# Patient Record
Sex: Male | Born: 1975 | Race: White | Hispanic: No | Marital: Single | Smoking: Former smoker
Health system: Southern US, Community
[De-identification: ages and names within clinical notes are randomized; demographics above are authoritative.]

## PROBLEM LIST (undated history)

## (undated) DIAGNOSIS — M869 Osteomyelitis, unspecified: Secondary | ICD-10-CM

## (undated) DIAGNOSIS — R55 Syncope and collapse: Secondary | ICD-10-CM

## (undated) DIAGNOSIS — E785 Hyperlipidemia, unspecified: Secondary | ICD-10-CM

## (undated) DIAGNOSIS — J302 Other seasonal allergic rhinitis: Secondary | ICD-10-CM

## (undated) DIAGNOSIS — K219 Gastro-esophageal reflux disease without esophagitis: Secondary | ICD-10-CM

## (undated) DIAGNOSIS — Z87898 Personal history of other specified conditions: Secondary | ICD-10-CM

## (undated) HISTORY — DX: Hyperlipidemia, unspecified: E78.5

## (undated) HISTORY — PX: TONSILLECTOMY: SUR1361

## (undated) HISTORY — DX: Gastro-esophageal reflux disease without esophagitis: K21.9

## (undated) HISTORY — DX: Syncope and collapse: R55

---

## 1998-08-19 ENCOUNTER — Ambulatory Visit (HOSPITAL_COMMUNITY): Admission: RE | Admit: 1998-08-19 | Discharge: 1998-08-19 | Payer: Self-pay | Admitting: Internal Medicine

## 1998-08-19 ENCOUNTER — Encounter: Payer: Self-pay | Admitting: Internal Medicine

## 2010-02-24 ENCOUNTER — Ambulatory Visit: Payer: Self-pay | Admitting: Family Medicine

## 2010-02-24 DIAGNOSIS — B354 Tinea corporis: Secondary | ICD-10-CM | POA: Insufficient documentation

## 2010-02-24 DIAGNOSIS — K219 Gastro-esophageal reflux disease without esophagitis: Secondary | ICD-10-CM | POA: Insufficient documentation

## 2010-02-25 LAB — CONVERTED CEMR LAB
ALT: 21 units/L (ref 0–53)
AST: 20 units/L (ref 0–37)
Albumin: 4.4 g/dL (ref 3.5–5.2)
Alkaline Phosphatase: 64 units/L (ref 39–117)
BUN: 13 mg/dL (ref 6–23)
Basophils Absolute: 0 10*3/uL (ref 0.0–0.1)
Basophils Relative: 0.4 % (ref 0.0–3.0)
Bilirubin, Direct: 0.1 mg/dL (ref 0.0–0.3)
CO2: 28 meq/L (ref 19–32)
Calcium: 8.5 mg/dL (ref 8.4–10.5)
Chloride: 100 meq/L (ref 96–112)
Cholesterol: 194 mg/dL (ref 0–200)
Creatinine, Ser: 0.8 mg/dL (ref 0.4–1.5)
Direct LDL: 117.1 mg/dL
Eosinophils Absolute: 0.1 10*3/uL (ref 0.0–0.7)
Eosinophils Relative: 2.6 % (ref 0.0–5.0)
GFR calc non Af Amer: 112.75 mL/min (ref >60)
Glucose, Bld: 121 mg/dL — ABNORMAL HIGH (ref 70–99)
HCT: 44.5 % (ref 39.0–52.0)
HDL: 37.1 mg/dL — ABNORMAL LOW (ref >39.00)
Hemoglobin: 15.6 g/dL (ref 13.0–17.0)
Lymphocytes Relative: 34.7 % (ref 12.0–46.0)
Lymphs Abs: 1.6 10*3/uL (ref 0.7–4.0)
MCHC: 35.2 g/dL (ref 30.0–36.0)
MCV: 88.1 fL (ref 78.0–100.0)
Monocytes Absolute: 0.3 10*3/uL (ref 0.1–1.0)
Monocytes Relative: 7 % (ref 3.0–12.0)
Neutro Abs: 2.6 10*3/uL (ref 1.4–7.7)
Neutrophils Relative %: 55.3 % (ref 43.0–77.0)
Platelets: 194 10*3/uL (ref 150.0–400.0)
Potassium: 3.7 meq/L (ref 3.5–5.1)
RBC: 5.05 M/uL (ref 4.22–5.81)
RDW: 14.2 % (ref 11.5–14.6)
Sodium: 140 meq/L (ref 135–145)
TSH: 0.67 microintl units/mL (ref 0.35–5.50)
Total Bilirubin: 0.7 mg/dL (ref 0.3–1.2)
Total CHOL/HDL Ratio: 5
Total Protein: 7 g/dL (ref 6.0–8.3)
Triglycerides: 274 mg/dL — ABNORMAL HIGH (ref 0.0–149.0)
VLDL: 54.8 mg/dL — ABNORMAL HIGH (ref 0.0–40.0)
WBC: 4.7 10*3/uL (ref 4.5–10.5)

## 2010-02-28 LAB — CONVERTED CEMR LAB
Chlamydia, Swab/Urine, PCR: NEGATIVE
GC Probe Amp, Urine: NEGATIVE

## 2010-06-07 ENCOUNTER — Telehealth: Payer: Self-pay | Admitting: Family Medicine

## 2010-06-15 ENCOUNTER — Ambulatory Visit: Payer: Self-pay | Admitting: Family Medicine

## 2010-06-15 LAB — CONVERTED CEMR LAB
Bacteria, UA: 0
Bilirubin Urine: NEGATIVE
Blood in Urine, dipstick: NEGATIVE
Casts: 0 /lpf
Glucose, Urine, Semiquant: NEGATIVE
Ketones, urine, test strip: NEGATIVE
Nitrite: NEGATIVE
Protein, U semiquant: NEGATIVE
RBC / HPF: 0
Specific Gravity, Urine: 1.015
Urine crystals, microscopic: 0 /hpf
Urobilinogen, UA: 0.2
WBC Urine, dipstick: NEGATIVE
WBC, UA: 0 cells/hpf
Yeast, UA: 0
pH: 7

## 2010-08-30 NOTE — Assessment & Plan Note (Signed)
Summary: NEW PT TO EST/CLE   Vital Signs:  Patient profile:   35 year old male Height:      71 inches Weight:      189.75 pounds BMI:     26.56 Temp:     98.2 degrees F oral Pulse rate:   96 / minute Pulse rhythm:   regular BP sitting:   130 / 86  (left arm) Cuff size:   regular  Vitals Entered By: Lewanda Rife LPN (February 24, 2010 11:24 AM) CC: New pt to establish   History of Present Illness: did not have a regular doctor in years  fairly healthy   takes omeprazole for presumed acid reflux  on it for 3 years  has heartburn every day without the medicine does not really watch diet  avoid spicy food if he feels it  antacids and zantac do not work   ventolin was given once when he had bronchitis  that was memorial weekend - better now  had an ekg at that time    quit smoking tab years ago   marijuana almost every day  helps with stress and enjoys it  lots of stress - work and every day life  no other drugs thinks he may have some anxiety - is unsure / not depressed   last Td - was in 98 needs to get update   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    Allergies (verified): No Known Drug Allergies  Past History:  Past Medical History:  seasonal nasal allergies once bout of bronchitis with reactive airways  borderline high chol  GERD  Past Surgical History: Tonsillectomy as a child no hosp   Family History: Father: Living heart attack                        skin cancer mother -- anxiety  Uncle Deceased pancreatic cancer Aunt breast cancer cousins with alcoholism (one died from cirrhosis)  Social  History: Occupation: Naval architect Single no children  Alcohol use-yes -- on the weekends ( does some binge drinking ) -- no problems with alcohol  Drug use-yes(marijuana) Regular exercise-yes- very physical job  Occupation:  employed Drug Use:  yes Does Patient Exercise:  yes  Review of Systems General:  Denies fatigue, fever, loss of appetite, and malaise. Eyes:  Denies blurring and eye irritation. CV:  Denies chest pain or discomfort, lightheadness, and palpitations. Resp:  Denies cough, shortness of breath, and wheezing. GI:  Complains of constipation and hemorrhoids; denies abdominal pain, change in bowel habits, indigestion, and nausea. GU:  Denies discharge, dysuria, genital sores, and urinary frequency. MS:  Denies joint pain, joint redness, and joint swelling. Derm:  Denies itching, lesion(s), poor wound healing, and rash. Neuro:  Denies numbness and tingling. Psych:  Denies depression, easily tearful, and irritability; may tend to be on the anxious side - not really a problem. Endo:  Denies cold intolerance, excessive thirst, excessive urination, and heat intolerance. Heme:  Denies abnormal bruising and bleeding.  Physical Exam  General:  Well-developed,well-nourished,in no acute distress; alert,appropriate and cooperative throughout examination Head:  normocephalic, atraumatic, and no abnormalities observed.   Eyes:  vision grossly intact, pupils equal, pupils round, and pupils reactive to light.  no conjunctival pallor, injection or icterus  Ears:  R ear normal and L ear normal.   Nose:  no nasal discharge.   Mouth:  pharynx pink and moist.   Neck:  supple with full rom and no masses or thyromegally, no JVD or carotid bruit  Chest Wall:  No deformities, masses, tenderness or gynecomastia noted. Lungs:  Normal respiratory effort, chest expands symmetrically. Lungs are clear to auscultation, no crackles or wheezes. Heart:  Normal rate and regular rhythm. S1 and S2 normal  without gallop, murmur, click, rub or other extra sounds. Abdomen:  Bowel sounds positive,abdomen soft and non-tender without masses, organomegaly or hernias noted. no renal bruits  Msk:  No deformity or scoliosis noted of thoracic or lumbar spine.  no acute joint changes  Pulses:  R and L carotid,radial,femoral,dorsalis pedis and posterior tibial pulses are full and equal bilaterally Extremities:  No clubbing, cyanosis, edema, or deformity noted with normal full range of motion of all joints.   Neurologic:  sensation intact to light touch, gait normal, and DTRs symmetrical and normal.  no tremor  Skin:  rash on L groin area under scrotum- some erythema / slt raised with sharp demarcation lentigos diffusely  no other skin changes tattoo noted  Cervical Nodes:  No lymphadenopathy noted Inguinal Nodes:  No significant adenopathy Psych:  normal affect, talkative and pleasant    Impression & Recommendations:  Problem # 1:  HEALTH MAINTENANCE EXAM (ICD-V70.0) Assessment New reviewed health habits including diet, exercise and skin cancer prevention reviewed health maintenance list and family history disc occas constipation disc various risks of marijuana smoking - incl lung risks  Orders: Venipuncture (16109) TLB-Lipid Panel (80061-LIPID) TLB-BMP (Basic Metabolic Panel-BMET) (80048-METABOL) TLB-CBC Platelet - w/Differential (85025-CBCD) TLB-Hepatic/Liver Function Pnl (80076-HEPATIC) TLB-TSH (Thyroid Stimulating Hormone) (84443-TSH)  Problem # 2:  SCREENING EXAMINATION FOR VENEREAL DISEASE (ICD-V74.5) Assessment: New at pt's request without symptoms - std screen disc imp of condom use to prevent stds  Orders: T-HIV Antibody  (Reflex) [id number]) T-RPR (Syphilis) (91478-29562) T-Chlamydia  Probe, urine (13086-57846) T-GC Probe, urine (96295-28413) Specimen Handling (24401)  Problem # 3:  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 1 by mouth once daily in am  Complete Medication List: 1)  Omeprazole 20 Mg Cpdr (Omeprazole) .Marland Kitchen.. 1 by mouth once daily in am 2)  Ventolin Hfa 108 (90 Base) Mcg/act Aers (Albuterol sulfate) .... One puff twice daily as needed  Other Orders: TD Toxoids IM 7 YR + (02725) Admin 1st Vaccine (36644)  Patient Instructions: 1)  I think you have a fungal "jock itch" type of rash 2)  use lotrimin or tinactin -any antifungal cream over the counter  3)  for constipation - more water and fruit and veggies  4)  can try miralax over the counter -- works well as directed  5)  tetnus shot today 6)  labs today  7)  follow up in 2-4 weeks for nurse visit for blood pressure check only  8)  watch salt in diet and drink lots of water  Prescriptions: OMEPRAZOLE 20 MG CPDR (OMEPRAZOLE) 1 by mouth once daily in am  #30 x 11   Entered and Authorized by:   Judith Part MD   Signed by:   Judith Part MD on 02/24/2010   Method used:   Electronically to        [facility]  [phone][facility] (retail)       [address]       [place], [place]  [id number]       Ph: [phone] or [phone]       Fax: [phone]   RxID:   [id number]   Current Allergies (reviewed today): No known allergies    Immunization History:  Hepatitis B Immunization History:    Hepatitis B # 1:  historical (02/05/1997)  MMR Immunization History:    MMR # 1:  historical (02/16/1997)  Immunizations Administered:  Tetanus Vaccine:    Vaccine Type: Td    Site: left deltoid    Mfr: Sanofi Pasteur    Dose: 0.5 ml    Route: IM    Given by: Lewanda Rife LPN     Exp. Date: 09/01/2011    Lot #: [id number]    VIS given: 06/18/07 version given February 24, 2010.

## 2010-08-30 NOTE — Letter (Signed)
Summary: Patient Questionnaire  Patient Questionnaire   Imported By: Beau Fanny 02/25/2010 09:36:56  _____________________________________________________________________  External Attachment:    Type:   Image     Comment:   External Document

## 2010-08-30 NOTE — Assessment & Plan Note (Signed)
Summary: DOT FORM/RI   Vital Signs:  Patient profile:   35 year old male Height:      71 inches Weight:      186.0 pounds BMI:     26.04 Temp:     98.0 degrees F oral Pulse rate:   84 / minute Pulse rhythm:   regular BP sitting:   130 / 88  (left arm) Cuff size:   regular  Vitals Entered By: Benny Lennert CMA Duncan Dull) (June 15, 2010 11:41 AM) CC: DOT FORMS Is Patient Diabetic? 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  is working on that    wt is down 3 lb with bmi of 26 is eating pretty fairly    vision nl  hearing nl  ua clear   Td 7/11 flu shot   gerd - well controlled   bp is 130/88 first check- similar to last time  has quit marijuana in prep for this new job    Allergies (verified): No Known Drug Allergies  Past History:  Past Medical History: Last updated: 02/24/2010  seasonal nasal allergies once bout of bronchitis with reactive airways  borderline high chol  GERD  Past Surgical History: Last updated: 02/24/2010 Tonsillectomy as a child no hosp   Family History: Last updated: 02/24/2010 Father: Living heart attack                        skin cancer mother -- anxiety  Uncle Deceased pancreatic cancer Aunt breast cancer cousins with alcoholism (one died from cirrhosis)  Social History: Last updated: 02/24/2010 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 Eyes:  Denies blurring, eye irritation, and eye pain. CV:  Denies chest pain or discomfort, lightheadness, near fainting, and palpitations. Resp:  Denies cough. GI:  Denies abdominal pain, change in bowel habits, indigestion, and nausea. GU:  Denies nocturia, urinary frequency, and urinary hesitancy. MS:  Denies joint pain, joint redness, joint swelling, muscle aches, and cramps. Derm:  Denies itching, lesion(s), poor wound healing, and rash. Neuro:  Denies numbness and tingling. Psych:  Denies anxiety and depression. Endo:  Denies cold intolerance, excessive thirst, excessive urination, and heat intolerance. Heme:  Denies abnormal bruising and bleeding.  Physical Exam  General:  Well-developed,well-nourished,in no acute distress; alert,appropriate and cooperative throughout examination Head:  normocephalic, atraumatic, and no abnormalities observed.   Eyes:  vision grossly intact, pupils equal, pupils round, and pupils reactive to light.  no conjunctival pallor, injection or icterus  Ears:  R ear normal and L ear normal.   Nose:  no nasal discharge.   Mouth:  pharynx pink and moist.   Neck:  supple with full rom and no masses or thyromegally, no JVD or carotid bruit  Chest Wall:  No deformities, masses, tenderness or gynecomastia noted. Lungs:  Normal respiratory effort, chest expands symmetrically. Lungs are clear to auscultation, no crackles or wheezes. Heart:  Normal rate and regular rhythm. S1 and S2 normal without gallop, murmur, click, rub or other extra sounds. Abdomen:  Bowel sounds positive,abdomen soft and non-tender without masses, organomegaly or hernias noted. no renal bruits  Msk:  No deformity or scoliosis noted of  thoracic or lumbar spine.  no acute joint changes  Pulses:  R and L carotid,radial,femoral,dorsalis pedis and posterior tibial pulses are full and equal bilaterally Extremities:  No clubbing, cyanosis, edema, or deformity noted with normal full range of motion of all joints.   Neurologic:  sensation intact to light touch, gait normal, and DTRs symmetrical and normal.  no tremor  Skin:  rash on L groin area under scrotum- some erythema / slt raised with sharp demarcation lentigos diffusely  no other skin changes tattoo noted  Cervical Nodes:  No lymphadenopathy noted Inguinal Nodes:  No significant adenopathy Psych:  normal affect, talkative and pleasant    Impression & Recommendations:  Problem # 1:  OTH GENERAL MEDICAL EXAMINATION ADMIN PURPOSES (ICD-V70.3) Assessment Comment Only no restricitons for job  bp is borderline -- will watch  nl vision/ hearing  no acute problems  urine clear  disc driving safety Orders: Work-Related DOT (04540) UA Dipstick W/ Micro (manual) (98119) Audiometry (92552) Vision Screening (14782)  Complete Medication List: 1)  Omeprazole 20 Mg Cpdr (Omeprazole) .Marland Kitchen.. 1 by mouth once daily in am 2)  Ventolin Hfa 108 (90 Base) Mcg/act Aers (Albuterol sulfate) .... One puff twice daily as needed  Patient Instructions: 1)  no restrictions for DOT 2)  please have forms scanned up front  3)  watch salt in your diet to prevent high blood pressure   Orders Added: 1)  Work-Related DOT [95621] 2)  UA Dipstick W/ Micro (manual) [81000] 3)  Audiometry [92552] 4)  Vision Screening [30865]    Current Allergies (reviewed today): No known allergies   Laboratory Results   Urine Tests  Date/Time Received: June 15, 2010 11:52 AM  Date/Time Reported: June 15, 2010 11:52 AM   Routine Urinalysis   Color: yellow Appearance: Clear Glucose: negative   (Normal Range: Negative) Bilirubin: negative   (Normal Range: Negative) Ketone: negative    (Normal Range: Negative) Spec. Gravity: 1.015   (Normal Range: 1.003-1.035) Blood: negative   (Normal Range: Negative) pH: 7.0   (Normal Range: 5.0-8.0) Protein: negative   (Normal Range: Negative) Urobilinogen: 0.2   (Normal Range: 0-1) Nitrite: negative   (Normal Range: Negative) Leukocyte Esterace: negative   (Normal Range: Negative)  Urine Microscopic WBC/HPF: 0 RBC/HPF: 0 Bacteria/HPF: 0 Mucous/HPF: few Epithelial/HPF: 2-3 Crystals/HPF: 0 Casts/LPF: 0 Yeast/HPF: 0 Other: 0

## 2010-08-30 NOTE — Progress Notes (Signed)
Summary: needs DOT form completed  Phone Note Call from Patient Call back at Home Phone [phone]   Caller: Patient Call For: Judith Part MD Summary of Call: Pt had physical in july but now needs DOT forms filled out.  OK for him to come in for nurse visit to check vitals that werent checked at last visit? Initial call taken by: Lowella Petties CMA, AAMA,  June 07, 2010 5:06 PM  Follow-up for Phone Call        will need repeat vitals as wel as hearing and vision and re check bp and urinalysis  I do need him to see me-- too much to do for a nurse visit -- but hopefully can get it done in a timely manner  please schedule any 30 min slot  Follow-up by: Judith Part MD,  June 07, 2010 5:11 PM  Additional Follow-up for Phone Call Additional follow up Details #1::        Left message for patient to call back. Lewanda Rife LPN  June 08, 2010 8:37 AM   Patient notified as instructed by telephone. 30 min appt scheduled 06/15/10 at 11:45am.Rena [facility] LPN  June 09, 2010 10:32 AM

## 2010-08-30 NOTE — Letter (Signed)
Summary: Medical Examination for [employer]   Imported By: Maryln Gottron 06/24/2010 09:12:24  _____________________________________________________________________  External Attachment:    Type:   Image     Comment:   External Document

## 2011-02-17 ENCOUNTER — Encounter: Payer: Self-pay | Admitting: *Deleted

## 2011-02-20 ENCOUNTER — Encounter: Payer: Self-pay | Admitting: Family Medicine

## 2011-02-20 ENCOUNTER — Ambulatory Visit (INDEPENDENT_AMBULATORY_CARE_PROVIDER_SITE_OTHER): Payer: Self-pay | Admitting: Family Medicine

## 2011-02-20 DIAGNOSIS — M76891 Other specified enthesopathies of right lower limb, excluding foot: Secondary | ICD-10-CM | POA: Insufficient documentation

## 2011-02-20 DIAGNOSIS — M6580 Other synovitis and tenosynovitis, unspecified site: Secondary | ICD-10-CM

## 2011-02-20 DIAGNOSIS — B354 Tinea corporis: Secondary | ICD-10-CM

## 2011-02-20 DIAGNOSIS — B351 Tinea unguium: Secondary | ICD-10-CM

## 2011-02-20 MED ORDER — KETOCONAZOLE 2 % EX CREA: TOPICAL_CREAM | Freq: Every day | CUTANEOUS | Status: AC

## 2011-02-20 NOTE — Progress Notes (Signed)
Subjective:    Patient ID: Ralph Oliver, male    DOB: 11-15-1975, 35 y.o.   MRN: [id number]  HPI Here to disc finger problem and also jock itch   L thumb -- ? Fungus - under nail / discolored and sore to the touch  Does not throb Nail is intact   Recurrent jock itch - driving him crazy in the heat and otc meds no longer work  Tried lotrimin antifungal and monistat Also zsorb powder  Used to get athletes foot but no more   Also had a trip planned to [place] -- had to cancel - due to worse tendonitis in his knee - cannot hike as he planned  Is worried he will have to be taken out of work again  Needs forms filled out for his trip insurance  Patient Active Problem List  Diagnoses  . TINEA CORPORIS  . GERD  . Tendonitis of knee, right  . Nail fungus   Past Medical History  Diagnosis Date  . Allergy   . GERD (gastroesophageal reflux disease)   . Borderline hyperlipidemia   . Bronchitis with asthma, acute    Past Surgical History  Procedure Date  . Tonsillectomy    History  Substance Use Topics  . Smoking status: Former Games developer  . Smokeless tobacco: Not on file  . Alcohol Use: Yes   Family History  Problem Relation Age of Onset  . Anxiety disorder Mother   . Heart attack Father   . Cancer Father     cancer   No Known Allergies Current Outpatient Prescriptions on File Prior to Visit  Medication Sig Dispense Refill  . omeprazole (PRILOSEC) 20 MG capsule Take 20 mg by mouth daily.        Marland Kitchen albuterol (VENTOLIN HFA) 108 (90 BASE) MCG/ACT inhaler Inhale 1 puff into the lungs 2 (two) times daily.            Review of Systems Review of Systems  Constitutional: Negative for fever, appetite change, fatigue and unexpected weight change.  Eyes: Negative for pain and visual disturbance.  Respiratory: Negative for cough and shortness of breath.   Cardiovascular: Negative.  for cp or sob Gastrointestinal: Negative for nausea, diarrhea and constipation.  Genitourinary:  Negative for urgency and frequency.  Skin: Negative for pallor. pos for rash  Neurological: Negative for weakness, light-headedness, numbness and headaches.  Hematological: Negative for adenopathy. Does not bruise/bleed easily.  Psychiatric/Behavioral: Negative for dysphoric mood. The patient is not nervous/anxious.          Objective:   Physical Exam  Constitutional: He appears well-developed and well-nourished. No distress.  HENT:  Head: Normocephalic and atraumatic.  Eyes: Conjunctivae and EOM are normal. Pupils are equal, round, and reactive to light.  Neck: Normal range of motion. Neck supple. No JVD present. Carotid bruit is not present. No thyromegaly present.  Cardiovascular: Normal rate, regular rhythm and normal heart sounds.   Pulmonary/Chest: Effort normal and breath sounds normal. No respiratory distress. He has no wheezes.  Abdominal: Soft. Bowel sounds are normal.  Musculoskeletal: He exhibits tenderness. He exhibits no edema.       R  knee - poor rom / pain on full flex and mcmurray test Patellar tendon tenderness Patella is slt hypermobile  Lymphadenopathy:    He has no cervical adenopathy.  Neurological: He is alert. He has normal reflexes.  Skin: Skin is warm and dry.       Medial L thumbnail is discolored yellow and  slt thickened slt tender  L inguinal area under scrotum is well demarcated redness with slt induration/ few satellite lestion Consistent with tinea   Psychiatric: He has a normal mood and affect.          Assessment & Plan:

## 2011-02-20 NOTE — Patient Instructions (Signed)
Use the nizoral (ketoconazole cream) daily on affected area in groin  Update me in 2 week if not much improved  Try to keep area clean and dry For nail- you can watch it and try otc products but if worse or bothersome please call and we would consider a trial of lamasil oral therapy  Wear your knee strap more regularly for tendonitis

## 2011-02-20 NOTE — Assessment & Plan Note (Signed)
Recurrent L testicular area  Not resp to otc prep Trial of ketoconazole 2% cream daily and update  Disc imp of keeping dry

## 2011-02-20 NOTE — Assessment & Plan Note (Signed)
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

## 2011-02-20 NOTE — Assessment & Plan Note (Signed)
Medial L thumbnail Mild Disc opt for tx - pt elected to observe and try otc nail prep If worse or painful will consider lamasil

## 2011-03-01 ENCOUNTER — Other Ambulatory Visit: Payer: Self-pay | Admitting: *Deleted

## 2011-03-01 MED ORDER — OMEPRAZOLE 20 MG PO CPDR: 20.0000 mg | DELAYED_RELEASE_CAPSULE | Freq: Every day | ORAL | Status: DC

## 2011-09-05 ENCOUNTER — Other Ambulatory Visit: Payer: Self-pay | Admitting: *Deleted

## 2011-09-05 MED ORDER — OMEPRAZOLE 20 MG PO CPDR
20.0000 mg | DELAYED_RELEASE_CAPSULE | Freq: Every day | ORAL | Status: DC
Start: 2011-09-05 — End: 2014-01-06

## 2011-09-05 NOTE — Telephone Encounter (Signed)
Will refill electronically Schedule PE with me this summer please

## 2011-09-05 NOTE — Telephone Encounter (Signed)
Patient not seen in over a year for cpx. Okay to refill?

## 2011-09-06 NOTE — Telephone Encounter (Signed)
Jamie please schedule PE this summer for pt. Thank you.

## 2011-09-07 NOTE — Telephone Encounter (Signed)
L/M for pt to call back and make appt for CPE.

## 2011-09-21 ENCOUNTER — Emergency Department: Payer: Self-pay | Admitting: Emergency Medicine

## 2011-09-21 ENCOUNTER — Telehealth: Payer: Self-pay | Admitting: Family Medicine

## 2011-09-21 ENCOUNTER — Encounter: Payer: Self-pay | Admitting: Cardiovascular Disease

## 2011-09-21 LAB — COMPREHENSIVE METABOLIC PANEL
Albumin: 4.4 g/dL (ref 3.4–5.0)
Alkaline Phosphatase: 57 U/L (ref 50–136)
Anion Gap: 12 (ref 7–16)
BUN: 12 mg/dL (ref 7–18)
Chloride: 105 mmol/L (ref 98–107)
Creatinine: 0.87 mg/dL (ref 0.60–1.30)
EGFR (African American): >60
Glucose: 103 mg/dL — ABNORMAL HIGH (ref 65–99)
Osmolality: 285 (ref 275–301)
Potassium: 3.9 mmol/L (ref 3.5–5.1)
SGOT(AST): 32 U/L (ref 15–37)
Sodium: 143 mmol/L (ref 136–145)
Total Protein: 7.6 g/dL (ref 6.4–8.2)

## 2011-09-21 LAB — CBC
HCT: 45.2 % (ref 40.0–52.0)
HGB: 15.4 g/dL (ref 13.0–18.0)
MCH: 30.3 pg (ref 26.0–34.0)
MCHC: 34.1 g/dL (ref 32.0–36.0)
MCV: 89 fL (ref 80–100)
RBC: 5.09 10*6/uL (ref 4.40–5.90)

## 2011-09-21 LAB — CK TOTAL AND CKMB (NOT AT ARMC)
CK, Total: 169 U/L (ref 35–232)
CK-MB: 1.9 ng/mL (ref 0.5–3.6)

## 2011-09-21 LAB — TROPONIN I: Troponin-I: <0.02 ng/mL

## 2011-09-21 NOTE — Telephone Encounter (Signed)
Triage Record Num: [id number] Operator: Di Kindle Patient Name: Ralph Oliver Call Date & Time: 09/21/2011 12:49:58PM Patient Phone: [phone] PCP: Audrie Gallus. Tower Patient Gender: Male PCP Fax : Patient DOB: 04-Aug-1975 Practice Name: [facility] Reason for Call: Caller: [place]/Patient; PCP: Roxy Manns A.; CB#: [phone]; Call regarding Fainting; occured during the night, blood on face, hit nose and chin on the floor, when he came to , on the floor on his back, he had a BM after he came to. Unable to schedule appt in EPIC, call to office per office profile: Aram Beecham recommends ED/UC. Protocol(s) Used: Fainting Recommended Outcome per Protocol: See Provider within 72 Hours Reason for Outcome: Symptom(s) follow sudden change in position AND not previously evaluated Care Advice: ~ The patient should not drive until provider is consulted. ~ IMMEDIATE ACTION Most adults need to drink 6-10 eight-ounce glasses (1.2-2.0 liters) of fluids per day unless previously told to limit fluid intake for other medical reasons. Limit fluids that contain caffeine, sugar or alcohol. Urine will be a very light yellow color when you drink enough fluids. ~ A temporary drop in blood pressure sometimes occurs with a quick change to an upright position (postural hypotension) and may cause light-headedness or dizziness. Change position slowly. Making a habit of rising slowly and sitting for a few minutes before standing to walk usually relieves the feeling of faintness. ~ 02/

## 2011-09-21 NOTE — Telephone Encounter (Signed)
I agree with recommendation to ED

## 2011-09-22 ENCOUNTER — Other Ambulatory Visit: Payer: Self-pay | Admitting: *Deleted

## 2011-09-22 DIAGNOSIS — R55 Syncope and collapse: Secondary | ICD-10-CM

## 2011-10-03 ENCOUNTER — Other Ambulatory Visit (INDEPENDENT_AMBULATORY_CARE_PROVIDER_SITE_OTHER): Payer: Commercial Indemnity

## 2011-10-03 ENCOUNTER — Other Ambulatory Visit: Payer: Self-pay

## 2011-10-03 DIAGNOSIS — R55 Syncope and collapse: Secondary | ICD-10-CM

## 2011-10-03 HISTORY — PX: TRANSTHORACIC ECHOCARDIOGRAM: SHX275

## 2011-10-13 ENCOUNTER — Telehealth: Payer: Self-pay | Admitting: Cardiovascular Disease

## 2011-10-13 NOTE — Telephone Encounter (Signed)
Fu call °Patient returning your call °

## 2011-10-13 NOTE — Telephone Encounter (Signed)
RESULTS GIVEN FROM ECHO/ PT TO KEEP APPOINTMENT/ VERBALIZED UNDERSTANDING

## 2011-10-20 ENCOUNTER — Encounter: Payer: Self-pay | Admitting: Cardiovascular Disease

## 2011-10-20 ENCOUNTER — Ambulatory Visit (INDEPENDENT_AMBULATORY_CARE_PROVIDER_SITE_OTHER): Payer: Commercial Indemnity | Admitting: Cardiovascular Disease

## 2011-10-20 VITALS — BP 131/90 | HR 86 | Ht 71.0 in | Wt 197.0 lb

## 2011-10-20 DIAGNOSIS — R55 Syncope and collapse: Secondary | ICD-10-CM

## 2011-10-20 NOTE — Progress Notes (Signed)
   Patient ID: ALBAN MARUCCI, male    DOB: 1975/12/04, 36 y.o.   MRN: [id number]  HPI Comments: Mr. Levenhagen is a pleasant 36 year old gentleman who presents after an episode of syncope on 09/21/2011. He got a bed at 3 in the morning and was walking to the bathroom when he passed out. He remembers waking up on the ground. He believes that he was unconscious for a very short period of time. He did sustain injuries to his face, shoulder. He has not had any further near syncope or syncope episodes since that time.  Recent echocardiogram was essentially normal with no significant valvular disease. Normal wall thickness and systolic function. No wall motion abnormality noted.   Holter monitor was completed that showed periods of sinus tachycardia while he was at work otherwise no significant arrhythmia.  Overall he currently feels well. He is never had episodes like this before. Blood work from [facility] was essentially normal. He does report smoking marijuana occasionally. He did smoke cigarettes previously.  EKG from the hospital showed normal sinus rhythm with rate 82 beats per minute with no significant ST or T wave changes   Outpatient Encounter Prescriptions as of 10/20/2011  Medication Sig Dispense Refill  . albuterol (VENTOLIN HFA) 108 (90 BASE) MCG/ACT inhaler Inhale 1 puff into the lungs every 6 (six) hours as needed.       Marland Kitchen ketoconazole (NIZORAL) 2 % cream Apply topically daily. To affected area  15 g  0  . omeprazole (PRILOSEC) 20 MG capsule Take 1 capsule (20 mg total) by mouth daily.  30 capsule  5    Review of Systems  Constitutional: Negative.   HENT: Negative.   Eyes: Negative.   Respiratory: Negative.   Cardiovascular: Negative.   Gastrointestinal: Negative.   Musculoskeletal: Negative.   Skin: Negative.   Neurological: Positive for syncope.  Hematological: Negative.   Psychiatric/Behavioral: Negative.   All other systems reviewed and are negative.    BP 131/90  Pulse 86  Ht  5\' 11"  (1.803 m)  Wt 197 lb (89.359 kg)  BMI 27.48 kg/m2  Physical Exam  Nursing note and vitals reviewed. Constitutional: He is oriented to person, place, and time. He appears well-developed and well-nourished.  HENT:  Head: Normocephalic.  Nose: Nose normal.  Mouth/Throat: Oropharynx is clear and moist.  Eyes: Conjunctivae are normal. Pupils are equal, round, and reactive to light.  Neck: Normal range of motion. Neck supple. No JVD present.  Cardiovascular: Normal rate, regular rhythm, S1 normal, S2 normal, normal heart sounds and intact distal pulses.  Exam reveals no gallop and no friction rub.   No murmur heard. Pulmonary/Chest: Effort normal and breath sounds normal. No respiratory distress. He has no wheezes. He has no rales. He exhibits no tenderness.  Abdominal: Soft. Bowel sounds are normal. He exhibits no distension. There is no tenderness.  Musculoskeletal: Normal range of motion. He exhibits no edema and no tenderness.  Lymphadenopathy:    He has no cervical adenopathy.  Neurological: He is alert and oriented to person, place, and time. Coordination normal.  Skin: Skin is warm and dry. No rash noted. No erythema.  Psychiatric: He has a normal mood and affect. His behavior is normal. Judgment and thought content normal.           Assessment and Plan

## 2011-10-20 NOTE — Patient Instructions (Signed)
You are doing well. No medication changes were made.  Please call us if you have new issues that need to be addressed before your next appt.    

## 2011-10-20 NOTE — Assessment & Plan Note (Signed)
Recent episode of syncope after getting out of bed early in the a.m.. Etiology still remains unclear. Echocardiogram was essentially normal. Holter monitor was essentially normal. He has not had any further episodes. Uncertain if this was hypotension, vasovagal, other arrhythmia. No further workup at this time. We have suggested he call office if he has any near syncope or syncope episodes for additional workup.

## 2011-11-27 ENCOUNTER — Encounter: Payer: Self-pay | Admitting: Cardiovascular Disease

## 2011-12-11 ENCOUNTER — Encounter: Payer: Self-pay | Admitting: Cardiovascular Disease

## 2013-04-23 ENCOUNTER — Telehealth: Payer: Self-pay | Admitting: Family Medicine

## 2013-04-23 ENCOUNTER — Ambulatory Visit (INDEPENDENT_AMBULATORY_CARE_PROVIDER_SITE_OTHER): Payer: Commercial Indemnity | Admitting: Family Medicine

## 2013-04-23 ENCOUNTER — Encounter: Payer: Self-pay | Admitting: Family Medicine

## 2013-04-23 VITALS — BP 118/70 | HR 94 | Temp 99.3°F | Ht 71.0 in | Wt 207.5 lb

## 2013-04-23 DIAGNOSIS — J069 Acute upper respiratory infection, unspecified: Secondary | ICD-10-CM | POA: Insufficient documentation

## 2013-04-23 MED ORDER — ERYTHROMYCIN BASE 500 MG PO TABS: 500.0000 mg | ORAL_TABLET | Freq: Two times a day (BID) | ORAL | Status: DC

## 2013-04-23 MED ORDER — ALBUTEROL SULFATE HFA 108 (90 BASE) MCG/ACT IN AERS: 1.0000 | INHALATION_SPRAY | Freq: Four times a day (QID) | RESPIRATORY_TRACT | Status: DC | PRN

## 2013-04-23 NOTE — Telephone Encounter (Signed)
Patient Information:  Caller Name: Fallon  Phone: [phone]  Patient: Ralph Oliver, Ralph Oliver  Gender: Male  DOB: 1975/11/22  Age: 37 Years  PCP: Tower, [employer] [facility])  Office Follow Up:  Does the office need to follow up with this patient?: Yes  Instructions For The Office: Patient has no inusrance and 54 week old new baby at home.  He is asking for Rx antibiotic or will the office work with him on payment.  please contact for assistance  RN Note:  Patient does not have insurance and has a new baby at home.  He is concerned about giving it to new baby (60 week old).  he is asking would Dr. Milinda Antis call him medication or office work with payment.  Home care advice given.  PLEASE CONTACT  Symptoms  Reason For Call & Symptoms: Patient thinks he has bronchitis.  Onset Sunday 04/20/13.  He is having tired , congestion, + coughing productive yellow, +runny nose yellow,  Unsure of fever/feels warm. Occasional wheezing- none at present.  Reviewed Health History In EMR: Yes  Reviewed Medications In EMR: Yes  Reviewed Allergies In EMR: Yes  Reviewed Surgeries / Procedures: Yes  Date of Onset of Symptoms: 04/20/2013  Treatments Tried: Treatment -  Alka seltzer cold and flu  Treatments Tried Worked: Yes  Any Fever: Yes  Fever Taken: Tactile  Fever Time Of Reading: 11:00:00  Fever Last Reading: N/A  Guideline(s) Used:  Cough  Disposition Per Guideline:   See Today or Tomorrow in Office  Reason For Disposition Reached:   Continuous (nonstop) coughing interferes with work or school and no improvement using cough treatment per Care Advice  Advice Given:  Reassurance  Coughing is the way that our lungs remove irritants and mucus. It helps protect our lungs from getting pneumonia.  You can get a dry hacking cough after a chest cold. Sometimes this type of cough can last 1-3 weeks, and be worse at night.  Here is some care advice that should help.  Cough Medicines:  OTC Cough Syrups: The most  common cough suppressant in OTC cough medications is dextromethorphan. Often the letters "DM" appear in the name.  Home Remedy - Hard Candy: Hard candy works just as well as medicine-flavored OTC cough drops. Diabetics should use sugar-free candy.  Home Remedy - Honey: This old home remedy has been shown to help decrease coughing at night. The adult dosage is 2 teaspoons (10 ml) at bedtime. Honey should not be given to infants under one year of age.  Caution - Dextromethorphan:   Do not try to completely suppress coughs that produce mucus and phlegm. Remember that coughing is helpful in bringing up mucus from the lungs and preventing pneumonia.  Coughing Spasms:  Drink warm fluids. Inhale warm mist (Reason: both relax the airway and loosen up the phlegm).  Suck on cough drops or hard candy to coat the irritated throat.  Prevent Dehydration:  Drink adequate liquids.  This will help soothe an irritated or dry throat and loosen up the phlegm.  Avoid Tobacco Smoke:  Smoking or being exposed to smoke makes coughs much worse.  Call Back If:  Difficulty breathing  Cough lasts more than 3 weeks  Fever lasts > 3 days  You become worse.  RN Overrode Recommendation:  Patient Requests Prescription  Patient has no inusrance and 41 week old new baby at home.  He is asking for Rx antibiotic or will the office work with him on payment.  please  contact for assistance

## 2013-04-23 NOTE — Progress Notes (Signed)
Subjective:    Patient ID: Ralph Oliver, male    DOB: 05/23/1976, 37 y.o.   MRN: [id number]  HPI Here for uri symptoms  Sunday - started as a runny nose and dry cough  By 1/2 way through the day - felt feverish/ sweating - came home from the hospital  Laid in bed for the next 2 d A bit better yesterday - but cough is deeper - yellow phlegm  Cannot cough a lot up / hears it rattle  He tried and alka selzer cold product and ibuprofen for headache  Headache is above eyes Had purulent nasal cong-now more clear   Was wheezing a bit  Not using an inhaler - did not use it    Just had a baby (was in the nicu for 6 days) - early birth- doing well at home now   Patient Active Problem List   Diagnosis Date Noted  . Viral URI with cough 04/23/2013  . Syncope 10/20/2011  . Tendonitis of knee, right 02/20/2011  . Nail fungus 02/20/2011  . TINEA CORPORIS 02/24/2010  . GERD 02/24/2010   Past Medical History  Diagnosis Date  . Allergy   . GERD (gastroesophageal reflux disease)   . Borderline hyperlipidemia   . Bronchitis with asthma, acute    Past Surgical History  Procedure Laterality Date  . Tonsillectomy     History  Substance Use Topics  . Smoking status: Former Smoker    Types: Cigarettes  . Smokeless tobacco: Never Used  . Alcohol Use: Yes   Family History  Problem Relation Age of Onset  . Anxiety disorder Mother   . Heart attack Father   . Cancer Father     cancer   No Known Allergies Current Outpatient Prescriptions on File Prior to Visit  Medication Sig Dispense Refill  . omeprazole (PRILOSEC) 20 MG capsule Take 1 capsule (20 mg total) by mouth daily.  30 capsule  5   No current facility-administered medications on file prior to visit.    Review of Systems Review of Systems  Constitutional: Negative for  appetite change,and unexpected weight change.  ENt pos for cong and rhinorrhea/ neg for facial pain  Eyes: Negative for pain and visual disturbance.   Respiratory: Negative for shortness of breath.   Cardiovascular: Negative for cp or palpitations    Gastrointestinal: Negative for nausea, diarrhea and constipation.  Genitourinary: Negative for urgency and frequency.  Skin: Negative for pallor or rash   Neurological: Negative for weakness, light-headedness, numbness and headaches.  Hematological: Negative for adenopathy. Does not bruise/bleed easily.  Psychiatric/Behavioral: Negative for dysphoric mood. The patient is not nervous/anxious.         Objective:   Physical Exam  Constitutional: He appears well-developed and well-nourished. No distress.  HENT:  Head: Normocephalic and atraumatic.  Right Ear: External ear normal.  Left Ear: External ear normal.  Mouth/Throat: Oropharynx is clear and moist. No oropharyngeal exudate.  Nares are injected and congested  No facial tenderness  Eyes: Conjunctivae and EOM are normal. Pupils are equal, round, and reactive to light. Right eye exhibits no discharge. Left eye exhibits no discharge.  Neck: Normal range of motion. Neck supple.  Cardiovascular: Regular rhythm and normal heart sounds.  Exam reveals no gallop.   Rate in low 90s   Pulmonary/Chest: Effort normal and breath sounds normal. No respiratory distress. He has no rales.  Scant wheeze only on forced exp No rhonchi Harsh bs  Lymphadenopathy:    He has  no cervical adenopathy.  Neurological: He is alert.  Skin: Skin is warm and dry. No rash noted.  Psychiatric: He has a normal mood and affect.          Assessment & Plan:

## 2013-04-23 NOTE — Telephone Encounter (Signed)
Left message on voice mail  to call back

## 2013-04-23 NOTE — Patient Instructions (Addendum)
Drink lots of fluids and rest when you can Try mucinex DM max over the counter for cough and congestion  If not improving in 3 days or if worse- fill the px for erythromycin If wheezing -fill px for the inhaler  Update if not starting to improve in a week or if worsening

## 2013-04-23 NOTE — Telephone Encounter (Signed)
I cannot px abx without examining him -may have to talk to office manager about payment opt/etc

## 2013-04-23 NOTE — Telephone Encounter (Signed)
Patient notified as instructed by telephone. Patient transferred to office manager to discuss payment.

## 2013-04-24 NOTE — Assessment & Plan Note (Signed)
Suspect viral  Will need to use care around newborn re: passing this  Disc symptomatic care - see instructions on AVS  Recommend mucinex DM max  Fluids/ rest , albuterol mdi prn wheeze  Update if not starting to improve in a week or if worsening   If worse or no imp in several days -will start px for erythromycin  Reminded him to get a Tdap at the health dept (since he has no ins)

## 2013-10-22 ENCOUNTER — Encounter: Payer: Self-pay | Admitting: Internal Medicine

## 2013-10-22 ENCOUNTER — Ambulatory Visit (INDEPENDENT_AMBULATORY_CARE_PROVIDER_SITE_OTHER): Payer: No Typology Code available for payment source | Admitting: Internal Medicine

## 2013-10-22 ENCOUNTER — Other Ambulatory Visit: Payer: Self-pay | Admitting: Internal Medicine

## 2013-10-22 VITALS — BP 118/76 | HR 88 | Temp 98.5°F | Wt 220.8 lb

## 2013-10-22 DIAGNOSIS — B351 Tinea unguium: Secondary | ICD-10-CM

## 2013-10-22 DIAGNOSIS — B356 Tinea cruris: Secondary | ICD-10-CM

## 2013-10-22 LAB — HEPATIC FUNCTION PANEL
ALBUMIN: 4.8 g/dL (ref 3.5–5.2)
ALK PHOS: 79 U/L (ref 39–117)
ALT: 42 U/L (ref 0–53)
AST: 27 U/L (ref 0–37)
Bilirubin, Direct: 0 mg/dL (ref 0.0–0.3)
TOTAL PROTEIN: 7.6 g/dL (ref 6.0–8.3)
Total Bilirubin: 0.5 mg/dL (ref 0.3–1.2)

## 2013-10-22 MED ORDER — TERBINAFINE HCL 250 MG PO TABS: 250.0000 mg | ORAL_TABLET | Freq: Every day | ORAL | Status: DC

## 2013-10-22 NOTE — Progress Notes (Signed)
Pre visit review using our clinic review tool, if applicable. No additional management support is needed unless otherwise documented below in the visit note. 

## 2013-10-22 NOTE — Patient Instructions (Addendum)
Ringworm, Nail A fungal infection of the nail (tinea unguium/onychomycosis) is common. It is common as the visible part of the nail is composed of dead cells which have no blood supply to help prevent infection. It occurs because fungi are everywhere and will pick any opportunity to grow on any dead material. Because nails are very slow growing they require up to 2 years of treatment with anti-fungal medications. The entire nail back to the base is infected. This includes approximately  of the nail which you cannot see. If your caregiver has prescribed a medication by mouth, take it every day and as directed. No progress will be seen for at least 6 to 9 months. Do not be disappointed! Because fungi live on dead cells with little or no exposure to blood supply, medication delivery to the infection is slow; thus the cure is slow. It is also why you can observe no progress in the first 6 months. The nail becoming cured is the base of the nail, as it has the blood supply. Topical medication such as creams and ointments are usually not effective. Important in successful treatment of nail fungus is closely following the medication regimen that your doctor prescribes. Sometimes you and your caregiver may elect to speed up this process by surgical removal of all the nails. Even this may still require 6 to 9 months of additional oral medications. See your caregiver as directed. Remember there will be no visible improvement for at least 6 months. See your caregiver sooner if other signs of infection (redness and swelling) develop. Document Released: 07/14/2000 Document Revised: 10/09/2011 Document Reviewed: 09/22/2008 ExitCare Patient Information 2014 [employer].  

## 2013-10-22 NOTE — Progress Notes (Signed)
Subjective:    Patient ID: Ralph Oliver, male    DOB: 04/06/1976, 38 y.o.   MRN: [id number]  HPI  Pt presents to the clinic today with c/o left thumb nail fungus. This started months ago. The nail is painful at times.  He has tried topical medications without relief. At his last visit, Dr. Milinda Antis said we would consider lamisil if no improvement with topical therapy. 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 He really feels like the topical medications are not as effective as they used to be. He is wondering what other treatment options are available.  Review of Systems      Past Medical History  Diagnosis Date  . Allergy   . GERD (gastroesophageal reflux disease)   . Borderline hyperlipidemia   . Bronchitis with asthma, acute     Current Outpatient Prescriptions  Medication Sig Dispense Refill  . albuterol (VENTOLIN HFA) 108 (90 BASE) MCG/ACT inhaler Inhale 1 puff into the lungs every 6 (six) hours as needed for wheezing.  1 Inhaler  1  . erythromycin base (E-MYCIN) 500 MG tablet Take 1 tablet (500 mg total) by mouth 2 (two) times daily.  14 tablet  0  . omeprazole (PRILOSEC) 20 MG capsule Take 1 capsule (20 mg total) by mouth daily.  30 capsule  5   No current facility-administered medications for this visit.    No Known Allergies  Family History  Problem Relation Age of Onset  . Anxiety disorder Mother   . Heart attack Father   . Cancer Father     cancer    History   Social History  . Marital Status: Married    Spouse Name: N/A    Number of Children: N/A  . Years of Education: N/A   Occupational History  . warehouse    Social History Main Topics  . Smoking status: Former Smoker    Types: Cigarettes  . Smokeless tobacco: Never Used  . Alcohol Use: Yes  . Drug Use: Yes     Comment: Marijuana  . Sexual Activity: Not on file   Other Topics Concern  . Not on file   Social History Narrative   Regular exercise:yes-- very physical job      Constitutional: Denies fever, malaise, fatigue, headache or abrupt weight changes.   Skin: Pt reports nail fungus. Denies redness, rashes, lesions or ulcercations.    No other specific complaints in a complete review of systems (except as listed in HPI above).  Objective:   Physical Exam   BP 118/76  Pulse 88  Temp(Src) 98.5 F (36.9 C) (Oral)  Wt 220 lb 12 oz (100.132 kg)  SpO2 96% Wt Readings from Last 3 Encounters:  10/22/13 220 lb 12 oz (100.132 kg)  04/23/13 207 lb 8 oz (94.121 kg)  10/20/11 197 lb (89.359 kg)    General: Appears his stated age, overweight but well developed, well nourished in NAD. Skin: Warm, dry and intact. Fungal infection noted on the left thumbnail. Red rash noted in groin consistent with jock itch. Cardiovascular: Normal rate and rhythm. S1,S2 noted.  No murmur, rubs or gallops noted. No JVD or BLE edema. No carotid bruits noted. Pulmonary/Chest: Normal effort and positive vesicular breath sounds. No respiratory distress. No wheezes, rales or ronchi noted.    BMET    Component Value Date/Time   NA 140 02/24/2010 1213   K 3.7 02/24/2010 1213   CL 100 02/24/2010 1213   CO2 28 02/24/2010 1213  GLUCOSE 121* 02/24/2010 1213   BUN 13 02/24/2010 1213   CREATININE 0.8 02/24/2010 1213   CALCIUM 8.5 02/24/2010 1213   GFRNONAA 112.75 02/24/2010 1213    Lipid Panel     Component Value Date/Time   CHOL 194 02/24/2010 1213   TRIG 274.0* 02/24/2010 1213   HDL 37.10* 02/24/2010 1213   CHOLHDL 5 02/24/2010 1213   VLDL 54.8* 02/24/2010 1213    CBC    Component Value Date/Time   WBC 4.7 02/24/2010 1213   RBC 5.05 02/24/2010 1213   HGB 15.6 02/24/2010 1213   HCT 44.5 02/24/2010 1213   PLT 194.0 02/24/2010 1213   MCV 88.1 02/24/2010 1213   MCHC 35.2 02/24/2010 1213   RDW 14.2 02/24/2010 1213   LYMPHSABS 1.6 02/24/2010 1213   MONOABS 0.3 02/24/2010 1213   EOSABS 0.1 02/24/2010 1213   BASOSABS 0.0 02/24/2010 1213    Hgb A1C No results found for this basename:  HGBA1C        Assessment & Plan:

## 2013-10-22 NOTE — Assessment & Plan Note (Signed)
Will check LFT's today If normal, will start Lamisil Will return to clinic in 4 weeks to recheck LFT's He does drink 10-12 beers on the weekend, encouraged him to hold off on that while he is taking the lamisil Discussed black box warning of liver failure, pt understands and agrees to continue with medication

## 2013-10-22 NOTE — Assessment & Plan Note (Signed)
OTC medications not as effective We are treating nail fungus with lamisil Will see if this helps the jock itch as well Wear cotton briefs

## 2013-11-21 ENCOUNTER — Other Ambulatory Visit: Payer: Self-pay | Admitting: Internal Medicine

## 2013-11-21 ENCOUNTER — Other Ambulatory Visit (INDEPENDENT_AMBULATORY_CARE_PROVIDER_SITE_OTHER): Payer: No Typology Code available for payment source

## 2013-11-21 DIAGNOSIS — B351 Tinea unguium: Secondary | ICD-10-CM

## 2013-11-21 LAB — HEPATIC FUNCTION PANEL
ALK PHOS: 81 U/L (ref 39–117)
ALT: 23 U/L (ref 0–53)
AST: 19 U/L (ref 0–37)
Albumin: 4.5 g/dL (ref 3.5–5.2)
Bilirubin, Direct: 0.1 mg/dL (ref 0.0–0.3)
Indirect Bilirubin: 0.3 mg/dL (ref 0.2–1.2)
TOTAL PROTEIN: 6.7 g/dL (ref 6.0–8.3)
Total Bilirubin: 0.4 mg/dL (ref 0.2–1.2)

## 2013-11-24 ENCOUNTER — Telehealth: Payer: Self-pay | Admitting: Family Medicine

## 2013-11-24 NOTE — Telephone Encounter (Signed)
Electronic refill request, please advise,

## 2013-11-24 NOTE — Telephone Encounter (Signed)
Please refill times one since his labs were recently ok

## 2013-11-24 NOTE — Telephone Encounter (Signed)
Pt was calling to let us know he took his last medication of Lamisil today and I saw where it was sent to us on Friday [facility] Not sure why ?? Please advise.

## 2013-11-24 NOTE — Telephone Encounter (Signed)
done

## 2013-11-24 NOTE — Telephone Encounter (Signed)
Rx sent to Nicki Reaperegina Baity, NP because she is the one who prescribed the Rx at his last OV with her. Rx request sent to Dr. Milinda Antisower

## 2013-11-26 ENCOUNTER — Encounter: Payer: Self-pay | Admitting: Family

## 2013-11-26 ENCOUNTER — Ambulatory Visit (INDEPENDENT_AMBULATORY_CARE_PROVIDER_SITE_OTHER): Payer: No Typology Code available for payment source | Admitting: Family

## 2013-11-26 ENCOUNTER — Telehealth: Payer: Self-pay | Admitting: Family Medicine

## 2013-11-26 VITALS — BP 120/80 | HR 81 | Temp 98.7°F | Wt 214.0 lb

## 2013-11-26 DIAGNOSIS — M79609 Pain in unspecified limb: Secondary | ICD-10-CM

## 2013-11-26 DIAGNOSIS — B351 Tinea unguium: Secondary | ICD-10-CM

## 2013-11-26 DIAGNOSIS — M79645 Pain in left finger(s): Secondary | ICD-10-CM

## 2013-11-26 DIAGNOSIS — B356 Tinea cruris: Secondary | ICD-10-CM

## 2013-11-26 MED ORDER — CLOTRIMAZOLE-BETAMETHASONE 1-0.05 % EX CREA: 1.0000 "application " | TOPICAL_CREAM | Freq: Two times a day (BID) | CUTANEOUS | Status: DC

## 2013-11-26 NOTE — Progress Notes (Signed)
Pre visit review using our clinic review tool, if applicable. No additional management support is needed unless otherwise documented below in the visit note. 

## 2013-11-26 NOTE — Patient Instructions (Signed)
[address] for xray   Jock Itch Jock itch is a fungal infection of the skin in the groin area. It is sometimes called "ringworm" even though it is not caused by a worm. A fungus is a type of germ that thrives in dark, damp places.  CAUSES  This infection may spread from:  A fungus infection elsewhere on the body (such as athlete's foot).  Sharing towels or clothing. This infection is more common in:  Hot, humid climates.  People who wear tight-fitting clothing or wet bathing suits for long periods of time.  Athletes.  Overweight people.  People with diabetes. SYMPTOMS  Jock itch causes the following symptoms:  Red, pink or brown rash in the groin. Rash may spread to the thighs, anus, and buttocks.  Itching. DIAGNOSIS  Your caregiver may make the diagnosis by looking at the rash. Sometimes a skin scraping will be sent to test for fungus. Testing can be done either by looking under the microscope or by doing a culture (test to try to grow the fungus). A culture can take up to 2 weeks to come back. TREATMENT  Jock itch may be treated with:  Skin cream or ointment to kill fungus.  Medicine by mouth to kill fungus.  Skin cream or ointment to calm the itching.  Compresses or medicated powders to dry the infected skin. HOME CARE INSTRUCTIONS   Be sure to treat the rash completely. Follow your caregiver's instructions. It can take a couple of weeks to treat. If you do not treat the infection long enough, the rash can come back.  Wear loose-fitting clothing.  Men should wear cotton boxer shorts.  Women should wear cotton underwear.  Avoid hot baths.  Dry the groin area well after bathing. SEEK MEDICAL CARE IF:   Your rash is worse.  Your rash is spreading.  Your rash returns after treatment is finished.  Your rash is not gone in 4 weeks. Fungal infections are slow to respond to treatment. Some redness may remain for several weeks after the fungus is  gone. SEEK IMMEDIATE MEDICAL CARE IF:  The area becomes red, warm, tender, and swollen.  You have a fever. Document Released: 07/07/2002 Document Revised: 10/09/2011 Document Reviewed: 06/05/2008 [facility] Patient Information 2014 [place], [place].

## 2013-11-26 NOTE — Telephone Encounter (Signed)
Patient Information:  Caller Name: Johna Sheriffrent  Phone: [phone]  Patient: Marilynn LatinoGray, Zarian S  Gender: Male  DOB: 06/20/1976  Age: [age] Years  [facility]  Office Follow Up:  Does the office need to follow up with this patient?: No  Instructions For The Office: N/A   Symptoms  Reason For Call & Symptoms: Patient calling.  Relates he is on Lamisil for a month  He has knot behind left thumbnail.  Fungus is starting to go away.  The knot is tender, mild redness, increased warmth; no drainage.  He relates he had formerly cleaned out the area and had purulent drainage from it.   Last office visit 10/22/13.  Emergent symptoms ruled out.  See Today or Tomorrow in Office per Wound Infection guideline due to Wound becomes more painful or swollen, 3 or more days after injury.  Reviewed Health History In EMR: Yes  Reviewed Medications In EMR: Yes  Reviewed Allergies In EMR: Yes  Reviewed Surgeries / Procedures: Yes  Date of Onset of Symptoms: 10/22/2013  Treatments Tried: Lamisil  Treatments Tried Worked: No  Guideline(s) Used:  Finger Pain  Wound Infection  Disposition Per Guideline:   See Today or Tomorrow in Office  Reason For Disposition Reached:   Wound becomes more painful or swollen, 3 or more days after injury  Advice Given:  Warm Soaks or Local Heat:  If the wound is open, soak it in warm water or put a warm wet cloth on the wound for 20 minutes 3 times per day. Use a warm saltwater solution containing 2 teaspoons of table salt per quart of water. If the wound is closed, apply a heating pad or warm, moist washcloth to the reddened area for 20 minutes 3 times per day.  Pain Medicines:  For pain relief, you can take either acetaminophen, ibuprofen, or naproxen.  Contagiousness:  For true wound infections, you can return to work or school after any fever is gone and you have received antibiotics for 24 hours.  Call Back If:   Wound becomes more tender  Redness  starts to spread  Pus, drainage, or fever occurs  You become worse  Patient Will Follow Care Advice:  YES  Appointment Scheduled:  11/26/2013 15:45:00 Appointment Scheduled Provider:  Adline M[place]ampbell, [place] [facility].

## 2013-11-26 NOTE — Progress Notes (Signed)
Subjective:    Patient ID: Ralph Oliver, male    DOB: 03/22/1976, 38 y.o.   MRN: [id number]  HPI 38 year old white male, nonsmoker is in today with complaints of a knot on the left side of his present several months. He reports that often becomes tender to touch and worse with working. Does not take any medication for relief. He was recently treated for any other nail by Ralph Kocheregina, NP and is currently on Lamisil he is tolerating well. He has a followup office visit in one month. Also reports a history of jock itch that's been present several months but typically worse in the summer months. No known history of diabetes. Describes it is itching and burning at times. Has not applied any creams. Lamisil does not appear to be helping.   Review of Systems  Constitutional: Negative.   Respiratory: Negative.   Cardiovascular: Negative.   Genitourinary: Negative.   Musculoskeletal:       Left thumb tenderness  Skin: Negative.   Neurological: Negative.   Psychiatric/Behavioral: Negative.    Past Medical History  Diagnosis Date  . Allergy   . GERD (gastroesophageal reflux disease)   . Borderline hyperlipidemia   . Bronchitis with asthma, acute     History   Social History  . Marital Status: Married    Spouse Name: N/A    Number of Children: N/A  . Years of Education: N/A   Occupational History  . warehouse    Social History Main Topics  . Smoking status: Former Smoker    Types: Cigarettes  . Smokeless tobacco: Never Used  . Alcohol Use: Yes  . Drug Use: Yes     Comment: Marijuana  . Sexual Activity: Not on file   Other Topics Concern  . Not on file   Social History Narrative   Regular exercise:yes-- very physical job    Past Surgical History  Procedure Laterality Date  . Tonsillectomy      Family History  Problem Relation Age of Onset  . Anxiety disorder Mother   . Heart attack Father   . Cancer Father     cancer    No Known Allergies  Current Outpatient  Prescriptions on File Prior to Visit  Medication Sig Dispense Refill  . albuterol (VENTOLIN HFA) 108 (90 BASE) MCG/ACT inhaler Inhale 1 puff into the lungs every 6 (six) hours as needed for wheezing.  1 Inhaler  1  . erythromycin base (E-MYCIN) 500 MG tablet Take 1 tablet (500 mg total) by mouth 2 (two) times daily.  14 tablet  0  . omeprazole (PRILOSEC) 20 MG capsule Take 1 capsule (20 mg total) by mouth daily.  30 capsule  5  . terbinafine (LAMISIL) 250 MG tablet TAKE 1 TABLET (250 MG TOTAL) BY MOUTH DAILY.  30 tablet  0   No current facility-administered medications on file prior to visit.    BP 120/80  Pulse 81  Temp(Src) 98.7 F (37.1 C) (Oral)  Wt 214 lb (97.07 kg)  SpO2 98%chart    Objective:   Physical Exam  Constitutional: He is oriented to person, place, and time. He appears well-developed and well-nourished.  Neck: Normal range of motion. Neck supple.  Cardiovascular: Normal rate, regular rhythm and normal heart sounds.   Pulmonary/Chest: Effort normal and breath sounds normal.  Genitourinary:  Fungal-appearing, red rash noted to the groin area. Well-defined.  Musculoskeletal: He exhibits tenderness.  Tenderness to palpation of the DIP of the left. The tenderness to palpation  of the nail bed. Improving onychomycosis  Neurological: He is alert and oriented to person, place, and time.  Skin: Skin is warm and dry.  Psychiatric: He has a normal mood and affect.          Assessment & Plan:  Ralph Oliver was seen today for other.  Diagnoses and associated orders for this visit:  Tinea cruris  Pain of left thumb - DG Finger Thumb Left; Future  Onychomycosis  Other Orders - clotrimazole-betamethasone (LOTRISONE) cream; Apply 1 application topically 2 (two) times daily.   Call with any questions or concerns. Recheck as scheduled and as needed.

## 2013-12-02 ENCOUNTER — Ambulatory Visit (INDEPENDENT_AMBULATORY_CARE_PROVIDER_SITE_OTHER)
Admission: RE | Admit: 2013-12-02 | Discharge: 2013-12-02 | Disposition: A | Discharge status: Home / Self Care | Payer: No Typology Code available for payment source | Source: Ambulatory Visit | Attending: Family | Admitting: Family

## 2013-12-02 DIAGNOSIS — M79645 Pain in left finger(s): Secondary | ICD-10-CM

## 2013-12-02 DIAGNOSIS — M79609 Pain in unspecified limb: Secondary | ICD-10-CM

## 2014-01-05 ENCOUNTER — Telehealth: Payer: Self-pay | Admitting: Family Medicine

## 2014-01-05 NOTE — Telephone Encounter (Signed)
I will see him then

## 2014-01-05 NOTE — Telephone Encounter (Signed)
Patient Information:  Caller Name: Chritopher  Phone: [phone]  Patient: Ralph Oliver, Ralph Oliver  Gender: Male  DOB: February 05, 1976  Age: 38 Years  PCP: [facility], [employer] [employer] (Family Practice)  Office Follow Up:  Does the office need to follow up with this patient?: No  Instructions For The Office: N/A   Symptoms  Reason For Call & Symptoms: Taking Lamicil for fungal growth under thumb and missed a few doses last month while at the beach and the infection returned. Took last dose today and he is worried that it will come back and is wondering if he needs to continue taking it.  He has had 2 liver tests and taken med for 2 months.  Thumb nail is clear but he has swollen sore spot behind thumb nail near knuckle. He had Xray taken of knuckle in April and it did not show any infection in bone or arthritis.  He is worried that knuckle soreness is related to fungus since it started around the same time as nail fungus one year ago.   Reviewed Health History In EMR: Yes  Reviewed Medications In EMR: Yes  Reviewed Allergies In EMR: Yes  Reviewed Surgeries / Procedures: Yes  Date of Onset of Symptoms: 12/29/2012  Guideline(s) Used:  Finger Pain  Disposition Per Guideline:   See Within 3 Days in Office  Reason For Disposition Reached:   Moderate pain (e.g., interferes with normal activities) and present > 3 days  Advice Given:  Expected Course:  Most of the time mild finger pain will get better over 2 to 3 days and goes away within a week.  If you do not get better during the next week, then you will need an appointment to see the doctor.  Call Back If:  Fever occurs  Redness or swelling appears  Pain lasts over 7 days  You become worse.  Patient Will Follow Care Advice:  YES  Appointment Scheduled:  01/06/2014 12:30:00 Appointment Scheduled Provider:  Roxy [facility])

## 2014-01-06 ENCOUNTER — Encounter: Payer: Self-pay | Admitting: Family Medicine

## 2014-01-06 ENCOUNTER — Ambulatory Visit (INDEPENDENT_AMBULATORY_CARE_PROVIDER_SITE_OTHER): Payer: No Typology Code available for payment source | Admitting: Family Medicine

## 2014-01-06 ENCOUNTER — Ambulatory Visit: Payer: Self-pay | Admitting: Family Medicine

## 2014-01-06 VITALS — BP 118/82 | HR 93 | Temp 98.5°F | Ht 71.0 in | Wt 209.5 lb

## 2014-01-06 DIAGNOSIS — B356 Tinea cruris: Secondary | ICD-10-CM

## 2014-01-06 DIAGNOSIS — M79645 Pain in left finger(s): Secondary | ICD-10-CM | POA: Insufficient documentation

## 2014-01-06 DIAGNOSIS — M79609 Pain in unspecified limb: Secondary | ICD-10-CM

## 2014-01-06 DIAGNOSIS — B351 Tinea unguium: Secondary | ICD-10-CM

## 2014-01-06 DIAGNOSIS — K219 Gastro-esophageal reflux disease without esophagitis: Secondary | ICD-10-CM

## 2014-01-06 MED ORDER — OMEPRAZOLE 20 MG PO CPDR: 20.0000 mg | DELAYED_RELEASE_CAPSULE | Freq: Every day | ORAL | Status: DC

## 2014-01-06 MED ORDER — TERBINAFINE HCL 250 MG PO TABS: ORAL_TABLET | ORAL | Status: DC

## 2014-01-06 NOTE — Progress Notes (Signed)
Subjective:    Patient ID: Ralph Oliver, male    DOB: 09/30/1975, 38 y.o.   MRN: [id number]  HPI Here for re check of his thumb  Took 2 mo of lamasil for onchomycosis  He forgot it on vacation- and the infection came back within a week   It is sore over the distal joint above nail - perhaps a little red  For a long time  X[facility] in May was fine - [facility]   No injury known   Lab Results  Component Value Date   ALT 23 11/21/2013   AST 19 11/21/2013   ALKPHOS 81 11/21/2013   BILITOT 0.4 11/21/2013    He has been very careful with alcohol use - while on the med  No binge drinking while on it   He also had lotrisone for jock itch- is feeling better - not itching as much   He is out of omeprazole px -getting otc which is expensive Has ins now and would like px -takes 20 mg daily  Has heartburn w/o it  Works well for him  Does watch diet   Patient Active Problem List   Diagnosis Date Noted  . Tinea cruris 10/22/2013  . Nail fungus 02/20/2011  . TINEA CORPORIS 02/24/2010  . GERD 02/24/2010   Past Medical History  Diagnosis Date  . Allergy   . GERD (gastroesophageal reflux disease)   . Borderline hyperlipidemia   . Bronchitis with asthma, acute    Past Surgical History  Procedure Laterality Date  . Tonsillectomy     History  Substance Use Topics  . Smoking status: Former Smoker    Types: Cigarettes  . Smokeless tobacco: Never Used  . Alcohol Use: Yes     Comment: occ   Family History  Problem Relation Age of Onset  . Anxiety disorder Mother   . Heart attack Father   . Cancer Father     cancer   No Known Allergies Current Outpatient Prescriptions on File Prior to Visit  Medication Sig Dispense Refill  . omeprazole (PRILOSEC) 20 MG capsule Take 1 capsule (20 mg total) by mouth daily.  30 capsule  5  . terbinafine (LAMISIL) 250 MG tablet TAKE 1 TABLET (250 MG TOTAL) BY MOUTH DAILY.  30 tablet  0   No current facility-administered medications on file prior  to visit.      Review of Systems    Review of Systems  Constitutional: Negative for fever, appetite change, fatigue and unexpected weight change.  Eyes: Negative for pain and visual disturbance.  Respiratory: Negative for cough and shortness of breath.   Cardiovascular: Negative for cp or palpitations    Gastrointestinal: Negative for nausea, diarrhea and constipation.  Genitourinary: Negative for urgency and frequency.  Skin: Negative for pallor or and pos for jock itch rash that is much imp MSK pos for thumb pain  Neurological: Negative for weakness, light-headedness, numbness and headaches.  Hematological: Negative for adenopathy. Does not bruise/bleed easily.  Psychiatric/Behavioral: Negative for dysphoric mood. The patient is not nervous/anxious.      Objective:   Physical Exam  Constitutional: He appears well-developed and well-nourished. No distress.  HENT:  Head: Normocephalic and atraumatic.  Mouth/Throat: Oropharynx is clear and moist.  Eyes: Conjunctivae and EOM are normal. Pupils are equal, round, and reactive to light.  Neck: Normal range of motion. Neck supple.  Cardiovascular: Normal rate, regular rhythm and intact distal pulses.   Pulmonary/Chest: Effort normal and breath sounds  normal.  Abdominal: Soft. Bowel sounds are normal. There is no tenderness.  Musculoskeletal: He exhibits tenderness. He exhibits no edema.  Tenderness over medial L thumb where there is a prominent area resembling a callus or poss Herberden node Nl rom thumb Some discoloration at proximal thumbnail   Lymphadenopathy:    He has no cervical adenopathy.  Neurological: He is alert. He has normal reflexes. No cranial nerve deficit. He exhibits normal muscle tone. Coordination normal.  Skin: Skin is warm. No rash noted. No erythema. No pallor.  Some mild discoloration at prox L thumb nail   No signs of paronychia  Psychiatric: He has a normal mood and affect.          Assessment &  Plan:   Problem List Items Addressed This Visit     Digestive   GERD     Pt now has insurance to get his PPI by px  Refilled his omeprazole- doing well with that     Relevant Medications      omeprazole (PRILOSEC) capsule     Musculoskeletal and Integument   Nail fungus - Primary     Recurrent on L thumb - with discomfort  Suspect prev course was not long enough-will continue for another 2 months  Reviewed LFTs Warned about alcohol intake - he voiced understanding  Re check 2 mo  In meantime will ref to hand specialist for joint pain in thumb I suspect is unrelated     Relevant Medications      terbinafine (LAMISIL) 250 MG tablet   Tinea cruris     Improved with lotrisone Will continue that and use of boxer briefs     Relevant Medications      terbinafine (LAMISIL) 250 MG tablet     Other   Pain of left thumb     At distal PIP joint medially - with lump vs callus there (he is a Psychologist, clinical and also Curator) Rev xray from 5/15- ok  Ref to hand specialist for further eval    Relevant Orders      Ambulatory referral to Orthopedic Surgery

## 2014-01-06 NOTE — Assessment & Plan Note (Signed)
At distal PIP joint medially - with lump vs callus there (he is a Nurse, mental health) Rev xray from 5/15- ok  Ref to hand specialist for further eval

## 2014-01-06 NOTE — Assessment & Plan Note (Signed)
Pt now has insurance to get his PPI by px  Refilled his omeprazole- doing well with that

## 2014-01-06 NOTE — Assessment & Plan Note (Signed)
Recurrent on L thumb - with discomfort  Suspect prev course was not long enough-will continue for another 2 months  Reviewed LFTs Warned about alcohol intake - he voiced understanding  Re check 2 mo  In meantime will ref to hand specialist for joint pain in thumb I suspect is unrelated

## 2014-01-06 NOTE — Patient Instructions (Signed)
Start back on lamasil after vacation  Keep hands clean/dry well / don't pick at the nail - try not to  Stop at check out for ref to hand specialist  Follow up after finishing lamasil

## 2014-01-06 NOTE — Progress Notes (Signed)
Pre visit review using our clinic review tool, if applicable. No additional management support is needed unless otherwise documented below in the visit note. 

## 2014-01-06 NOTE — Assessment & Plan Note (Signed)
Improved with lotrisone Will continue that and use of boxer briefs

## 2014-03-03 ENCOUNTER — Telehealth: Payer: Self-pay | Admitting: Family Medicine

## 2014-03-03 DIAGNOSIS — B351 Tinea unguium: Secondary | ICD-10-CM | POA: Insufficient documentation

## 2014-03-03 NOTE — Telephone Encounter (Signed)
Ref to derm done

## 2014-03-03 NOTE — Telephone Encounter (Signed)
Pt called requesting dermatology referral for his thumb nail fungus. Pt saw hand specialist- Dr. Janee Mornhompson and said pt would benefit from seeing a dermatologist. Pt would like to go to [facility] on [address] in [place].  Pt phone # [phone]

## 2014-03-27 ENCOUNTER — Other Ambulatory Visit: Payer: Self-pay | Admitting: Family Medicine

## 2014-03-27 NOTE — Telephone Encounter (Signed)
If it is not improving - I want to refer him to a dermatologist -let me know if he is ok with that

## 2014-03-27 NOTE — Telephone Encounter (Signed)
Pt went to dermatologist, and they did a nail clipping that came back neg for fungus, so they did a nail cx. and that came back neg. for bacteria, dermatologist did an extensive fungal test that is still process. Pt said his nails are improving on he lamisil but he missed a few days because he was on vacation and it came right back, per Dr. Milinda Antis it's okay to refill for one more month until dermatology test come back. Done and pt notified

## 2014-03-27 NOTE — Telephone Encounter (Signed)
Pt left v/m nail fungus is still there and wants to know if Dr Milinda Antis wants pt to continue taking lamisil. Pt request cb.

## 2014-04-07 ENCOUNTER — Telehealth: Payer: Self-pay | Admitting: Family Medicine

## 2014-04-07 NOTE — Telephone Encounter (Signed)
Patient Information:  Caller Name: Drayden  Phone: [phone]  Patient: Ralph Oliver, Ralph Oliver  Gender: Male  DOB: [birth year]  Age: 38 Years  PCP: [facility], [employer] [facility])  Office Follow Up:  Does the office need to follow up with this patient?: No  Instructions For The Office: N/A  RN Note:  Thomasenia Sales in office spoke with Dr Milinda Antis, can be seen by someone else in office or go back to [facility] if still very uncomfortable.  Symptoms  Reason For Call & Symptoms: Micah Flesher out of area for weekend,  knows has mosquito bites.  41 month old daughter Dx with impetigo.  Played in band on Friday night 9/4.  Rash started omn fac3e on Saturday - looked at firest like dry skin then bumps.  Spread to hands and feet and by night finger tips and toes, heels hurt.  Then by Sunday 9/6 had bumps all over including legs, trunk.  Heels were blisdtery or red under the skin, unable to walk on them - looked like blood. vessels have burst. Rash  Reviewed Health History In EMR: Yes  Reviewed Medications In EMR: Yes  Reviewed Allergies In EMR: Yes  Reviewed Surgeries / Procedures: Yes  Date of Onset of Symptoms: 04/04/2014  Treatments Tried: Benadryl helped itching some Started antibiotic and Benadryl at [facility] but had no idea what he might have Lamasil had been stopped about one week prior to Sx  Treatments Tried Worked: No  Guideline(s) Used:  Rash or Redness - Widespread  Insect Bite  Mosquito Bite  Disposition Per Guideline:   Go to ED Now (or to Office with PCP Approval)  Reason For Disposition Reached:   Patient sounds very sick or weak to the triager  Advice Given:  N/A  Patient Will Follow Care Advice:  YES  Appointment Scheduled:  04/08/2014 13:00:00 Appointment Scheduled Provider:  Nicki Reaper

## 2014-04-07 NOTE — Telephone Encounter (Signed)
Emma with [facility] said pt had rash on face 04/04/14 while out of town, pt developed bumps on hands and feet which spread to pts trunk and legs. Rash is blister like on pts heels and pt is walking on toes due to too painful to walk flat footed. Pt stopped at [facility] on way home on 04/05/14 and was given antibiotic and prednisone but pt is no better. No available appts today. Emma scheduled pt with Nicki Reaper NP on 04/08/14 and if condition changes or worsens prior to appt pt will go to [facility].

## 2014-04-08 ENCOUNTER — Encounter: Payer: Self-pay | Admitting: Internal Medicine

## 2014-04-08 ENCOUNTER — Ambulatory Visit (INDEPENDENT_AMBULATORY_CARE_PROVIDER_SITE_OTHER): Payer: No Typology Code available for payment source | Admitting: Internal Medicine

## 2014-04-08 VITALS — BP 128/70 | HR 84 | Temp 98.2°F | Wt 216.5 lb

## 2014-04-08 DIAGNOSIS — R21 Rash and other nonspecific skin eruption: Secondary | ICD-10-CM

## 2014-04-08 LAB — COMPREHENSIVE METABOLIC PANEL
ALBUMIN: 4.1 g/dL (ref 3.5–5.2)
ALT: 24 U/L (ref 0–53)
AST: 23 U/L (ref 0–37)
Alkaline Phosphatase: 77 U/L (ref 39–117)
BUN: 13 mg/dL (ref 6–23)
CALCIUM: 9.5 mg/dL (ref 8.4–10.5)
CHLORIDE: 104 meq/L (ref 96–112)
CO2: 26 mEq/L (ref 19–32)
Creatinine, Ser: 0.8 mg/dL (ref 0.4–1.5)
GFR: 111.52 mL/min (ref >60.00)
Glucose, Bld: 136 mg/dL — ABNORMAL HIGH (ref 70–99)
Potassium: 4.4 mEq/L (ref 3.5–5.1)
SODIUM: 139 meq/L (ref 135–145)
Total Bilirubin: 0.3 mg/dL (ref 0.2–1.2)
Total Protein: 7.7 g/dL (ref 6.0–8.3)

## 2014-04-08 LAB — CBC
HCT: 43.9 % (ref 39.0–52.0)
Hemoglobin: 14.6 g/dL (ref 13.0–17.0)
MCHC: 33.2 g/dL (ref 30.0–36.0)
MCV: 87.8 fl (ref 78.0–100.0)
Platelets: 284 10*3/uL (ref 150.0–400.0)
RBC: 5 Mil/uL (ref 4.22–5.81)
RDW: 14.2 % (ref 11.5–15.5)
WBC: 11.1 10*3/uL — ABNORMAL HIGH (ref 4.0–10.5)

## 2014-04-08 LAB — POCT RAPID STREP A (OFFICE): Rapid Strep A Screen: NEGATIVE

## 2014-04-08 LAB — MONONUCLEOSIS SCREEN: Mono Screen: NEGATIVE

## 2014-04-08 NOTE — Progress Notes (Signed)
Subjective:    Patient ID: Ralph Oliver, male    DOB: [birth year], 38 y.o.   MRN: [id number]  HPI  Pt presents to the clinic with c/o rash. He noticed this 3 days ago. The rash is all over his body. It started out on his face, then spread to his hands and feet. It also spread to his back and chest but he reports the rash on his torso looks different than the rash that is everywhere else. It is itchy and painful at times. He has noted some areas with blisters. He did go to [facility] for the same 3 days ago. He was started on Prednisone and and antibiotic. He cannot remember the name of the antibiotic. He reports that he has seen no improvement in the rash. He is concerned because his daughter was recently diagnosed with impetigo 2 weeks ago. The only medication change he had recently was Lamisil for nail fungus, but he has been on that for about 6 months.  Review of Systems      Past Medical History  Diagnosis Date  . Allergy   . GERD (gastroesophageal reflux disease)   . Borderline hyperlipidemia   . Bronchitis with asthma, acute     Current Outpatient Prescriptions  Medication Sig Dispense Refill  . omeprazole (PRILOSEC) 20 MG capsule Take 1 capsule (20 mg total) by mouth daily.  30 capsule  11  . terbinafine (LAMISIL) 250 MG tablet TAKE 1 TABLET (250 MG TOTAL) BY MOUTH DAILY.  30 tablet  0   No current facility-administered medications for this visit.    No Known Allergies  Family History  Problem Relation Age of Onset  . Anxiety disorder Mother   . Heart attack Father   . Cancer Father     cancer    History   Social History  . Marital Status: Married    Spouse Name: N/A    Number of Children: N/A  . Years of Education: N/A   Occupational History  . warehouse    Social History Main Topics  . Smoking status: Former Smoker    Types: Cigarettes  . Smokeless tobacco: Never Used  . Alcohol Use: Yes     Comment: occ  . Drug Use: Yes     Comment: Marijuana  . Sexual  Activity: Not on file   Other Topics Concern  . Not on file   Social History Narrative   Regular exercise:yes-- very physical job     Constitutional: Denies fever, malaise, fatigue, headache or abrupt weight changes. HEENT: Pt reports sore throat. Denies runny nose, ear pain.  Skin: Pt reports rash. Denies lesions or ulcercations.   No other specific complaints in a complete review of systems (except as listed in HPI above).  Objective:   Physical Exam   BP 128/70  Pulse 84  Temp(Src) 98.2 F (36.8 C) (Oral)  Wt 216 lb 8 oz (98.204 kg)  SpO2 98% Wt Readings from Last 3 Encounters:  04/08/14 216 lb 8 oz (98.204 kg)  01/06/14 209 lb 8 oz (95.029 kg)  11/26/13 214 lb (97.07 kg)    General: Appears his stated age, well developed, well nourished in NAD. Skin: Widespread, blanchable, maculopapular rash noted on his back, abdomen and chest. Red, raised, non blancheable rash noted on arms and legs. Pea sized red blister noted on left medial lower leg. Purpura noted on heels. HEENT: Throat/Mouth: Teeth present, mucosa pink and moist, no exudate, lesions or ulcerations noted.  Cardiovascular: Normal  rate and rhythm. S1,S2 noted.  No murmur, rubs or gallops noted. Pulmonary/Chest: Normal effort and positive vesicular breath sounds. No respiratory distress. No wheezes, rales or ronchi noted.   BMET    Component Value Date/Time   NA 140 02/24/2010 1213   K 3.7 02/24/2010 1213   CL 100 02/24/2010 1213   CO2 28 02/24/2010 1213   GLUCOSE 121* 02/24/2010 1213   BUN 13 02/24/2010 1213   CREATININE 0.8 02/24/2010 1213   CALCIUM 8.5 02/24/2010 1213   GFRNONAA 112.75 02/24/2010 1213    Lipid Panel     Component Value Date/Time   CHOL 194 02/24/2010 1213   TRIG 274.0* 02/24/2010 1213   HDL 37.10* 02/24/2010 1213   CHOLHDL 5 02/24/2010 1213   VLDL 54.8* 02/24/2010 1213    CBC    Component Value Date/Time   WBC 4.7 02/24/2010 1213   RBC 5.05 02/24/2010 1213   HGB 15.6 02/24/2010 1213   HCT  44.5 02/24/2010 1213   PLT 194.0 02/24/2010 1213   MCV 88.1 02/24/2010 1213   MCHC 35.2 02/24/2010 1213   RDW 14.2 02/24/2010 1213   LYMPHSABS 1.6 02/24/2010 1213   MONOABS 0.3 02/24/2010 1213   EOSABS 0.1 02/24/2010 1213   BASOSABS 0.0 02/24/2010 1213    Hgb A1C No results found for this basename: HGBA1C        Assessment & Plan:   Rash:  He did have blood work at [facility]- had him call to see if the results were back. They are not. Will order CBC, CMET and mono screen RST: negative Hold the lamisil and antibiotic Continue the prednisone Discussed the case with pt's PCP and had her look at rash for second opinion- she agrees to plan above  Will call you with the results as soon as they come back

## 2014-04-08 NOTE — Progress Notes (Signed)
Pre visit review using our clinic review tool, if applicable. No additional management support is needed unless otherwise documented below in the visit note. 

## 2014-04-08 NOTE — Patient Instructions (Signed)

## 2014-08-27 ENCOUNTER — Encounter (HOSPITAL_BASED_OUTPATIENT_CLINIC_OR_DEPARTMENT_OTHER): Payer: Self-pay | Admitting: *Deleted

## 2014-08-27 NOTE — Progress Notes (Signed)
NPO AFTER MN WITH EXCEPTION CLEAR LIQUIDS UNTIL 0800 (NO CREAM/ MILK PRODUCTS).  ARRIVE AT 1230. NEEDS HG. WILL TAKE PRILOSEC AM DOS W/ SIPS OF WATER.

## 2014-09-02 ENCOUNTER — Ambulatory Visit (HOSPITAL_BASED_OUTPATIENT_CLINIC_OR_DEPARTMENT_OTHER): Payer: 59 | Admitting: Anesthesiology

## 2014-09-02 ENCOUNTER — Encounter (HOSPITAL_BASED_OUTPATIENT_CLINIC_OR_DEPARTMENT_OTHER): Payer: Self-pay | Admitting: Anesthesiology

## 2014-09-02 ENCOUNTER — Ambulatory Visit (HOSPITAL_BASED_OUTPATIENT_CLINIC_OR_DEPARTMENT_OTHER)
Admission: RE | Admit: 2014-09-02 | Discharge: 2014-09-02 | Disposition: A | Discharge status: Home / Self Care | Payer: 59 | Source: Ambulatory Visit | Attending: Orthopedic Surgery | Admitting: Orthopedic Surgery

## 2014-09-02 ENCOUNTER — Encounter (HOSPITAL_BASED_OUTPATIENT_CLINIC_OR_DEPARTMENT_OTHER)
Admission: RE | Disposition: A | Discharge status: Home / Self Care | Payer: Self-pay | Source: Ambulatory Visit | Attending: Orthopedic Surgery

## 2014-09-02 DIAGNOSIS — K219 Gastro-esophageal reflux disease without esophagitis: Secondary | ICD-10-CM | POA: Insufficient documentation

## 2014-09-02 DIAGNOSIS — M869 Osteomyelitis, unspecified: Secondary | ICD-10-CM

## 2014-09-02 DIAGNOSIS — E785 Hyperlipidemia, unspecified: Secondary | ICD-10-CM | POA: Insufficient documentation

## 2014-09-02 DIAGNOSIS — M868X4 Other osteomyelitis, hand: Secondary | ICD-10-CM | POA: Insufficient documentation

## 2014-09-02 HISTORY — DX: Personal history of other specified conditions: Z87.898

## 2014-09-02 HISTORY — DX: Other seasonal allergic rhinitis: J30.2

## 2014-09-02 HISTORY — DX: Osteomyelitis, unspecified: M86.9

## 2014-09-02 HISTORY — PX: INCISION AND DRAINAGE OF WOUND: SHX1803

## 2014-09-02 LAB — POCT HEMOGLOBIN-HEMACUE: HEMOGLOBIN: 15.9 g/dL (ref 13.0–17.0)

## 2014-09-02 SURGERY — IRRIGATION AND DEBRIDEMENT WOUND
Anesthesia: General | Site: Thumb | Laterality: Left

## 2014-09-02 MED ORDER — FENTANYL CITRATE 0.05 MG/ML IJ SOLN
INTRAMUSCULAR | Status: DC | PRN
  Administered 2014-09-02 (×4): 50 ug via INTRAVENOUS

## 2014-09-02 MED ORDER — DOCUSATE SODIUM 100 MG PO CAPS: 100.0000 mg | ORAL_CAPSULE | Freq: Two times a day (BID) | ORAL | Status: DC

## 2014-09-02 MED ORDER — MIDAZOLAM HCL 2 MG/2ML IJ SOLN
INTRAMUSCULAR | Status: AC
  Filled 2014-09-02: qty 2

## 2014-09-02 MED ORDER — LACTATED RINGERS IV SOLN
INTRAVENOUS | Status: DC
  Administered 2014-09-02: 13:00:00 via INTRAVENOUS
  Filled 2014-09-02: qty 1000

## 2014-09-02 MED ORDER — OXYCODONE-ACETAMINOPHEN 5-325 MG PO TABS
1.0000 | ORAL_TABLET | ORAL | Status: AC | PRN
  Administered 2014-09-02: 1 via ORAL
  Filled 2014-09-02: qty 2

## 2014-09-02 MED ORDER — PROPOFOL 10 MG/ML IV BOLUS
INTRAVENOUS | Status: DC | PRN
  Administered 2014-09-02: 200 mg via INTRAVENOUS

## 2014-09-02 MED ORDER — FENTANYL CITRATE 0.05 MG/ML IJ SOLN
INTRAMUSCULAR | Status: AC
  Filled 2014-09-02: qty 2

## 2014-09-02 MED ORDER — 0.9 % SODIUM CHLORIDE (POUR BTL) OPTIME
TOPICAL | Status: DC | PRN
  Administered 2014-09-02: 500 mL

## 2014-09-02 MED ORDER — BUPIVACAINE HCL (PF) 0.25 % IJ SOLN
INTRAMUSCULAR | Status: DC | PRN
  Administered 2014-09-02: 7 mL

## 2014-09-02 MED ORDER — CLINDAMYCIN PHOSPHATE 600 MG/50ML IV SOLN
INTRAVENOUS | Status: AC
  Filled 2014-09-02: qty 50

## 2014-09-02 MED ORDER — MIDAZOLAM HCL 5 MG/5ML IJ SOLN
INTRAMUSCULAR | Status: DC | PRN
  Administered 2014-09-02: 2 mg via INTRAVENOUS

## 2014-09-02 MED ORDER — FENTANYL CITRATE 0.05 MG/ML IJ SOLN
INTRAMUSCULAR | Status: AC
  Filled 2014-09-02: qty 4

## 2014-09-02 MED ORDER — CLINDAMYCIN HCL 300 MG PO CAPS: 300.0000 mg | ORAL_CAPSULE | Freq: Three times a day (TID) | ORAL | Status: DC

## 2014-09-02 MED ORDER — FENTANYL CITRATE 0.05 MG/ML IJ SOLN
25.0000 ug | INTRAMUSCULAR | Status: DC | PRN
  Administered 2014-09-02: 50 ug via INTRAVENOUS
  Filled 2014-09-02: qty 1

## 2014-09-02 MED ORDER — LIDOCAINE HCL (CARDIAC) 20 MG/ML IV SOLN
INTRAVENOUS | Status: DC | PRN
  Administered 2014-09-02: 100 mg via INTRAVENOUS

## 2014-09-02 MED ORDER — CHLORHEXIDINE GLUCONATE 4 % EX LIQD
60.0000 mL | Freq: Once | CUTANEOUS | Status: DC
  Filled 2014-09-02: qty 60

## 2014-09-02 MED ORDER — PROMETHAZINE HCL 25 MG/ML IJ SOLN
6.2500 mg | INTRAMUSCULAR | Status: DC | PRN
  Filled 2014-09-02: qty 1

## 2014-09-02 MED ORDER — CLINDAMYCIN PHOSPHATE 600 MG/50ML IV SOLN
INTRAVENOUS | Status: DC | PRN
  Administered 2014-09-02: 600 mg via INTRAVENOUS

## 2014-09-02 MED ORDER — DEXAMETHASONE SODIUM PHOSPHATE 4 MG/ML IJ SOLN
INTRAMUSCULAR | Status: DC | PRN
  Administered 2014-09-02: 10 mg via INTRAVENOUS

## 2014-09-02 MED ORDER — ONDANSETRON HCL 4 MG/2ML IJ SOLN
INTRAMUSCULAR | Status: DC | PRN
  Administered 2014-09-02: 4 mg via INTRAVENOUS

## 2014-09-02 MED ORDER — OXYCODONE-ACETAMINOPHEN 5-325 MG PO TABS
ORAL_TABLET | ORAL | Status: AC
  Filled 2014-09-02: qty 1

## 2014-09-02 MED ORDER — OXYCODONE-ACETAMINOPHEN 10-325 MG PO TABS: 1.0000 | ORAL_TABLET | ORAL | Status: DC | PRN

## 2014-09-02 SURGICAL SUPPLY — 42 items
BLADE SURG 15 STRL LF DISP TIS (BLADE) ×1 IMPLANT
BLADE SURG 15 STRL SS (BLADE) ×2
BNDG COHESIVE 1X5 TAN STRL LF (GAUZE/BANDAGES/DRESSINGS) ×3 IMPLANT
BNDG CONFORM 2 STRL LF (GAUZE/BANDAGES/DRESSINGS) ×3 IMPLANT
BNDG ESMARK 4X9 LF (GAUZE/BANDAGES/DRESSINGS) ×3 IMPLANT
CORDS BIPOLAR (ELECTRODE) ×3 IMPLANT
COVER MAYO STAND STRL (DRAPES) ×3 IMPLANT
CUFF TOURNIQUET SINGLE 18IN (TOURNIQUET CUFF) ×3 IMPLANT
DRAPE EXTREMITY T 121X128X90 (DRAPE) ×3 IMPLANT
DRAPE SURG 17X23 STRL (DRAPES) ×3 IMPLANT
DRSG EMULSION OIL 3X3 NADH (GAUZE/BANDAGES/DRESSINGS) ×3 IMPLANT
GAUZE XEROFORM 1X8 LF (GAUZE/BANDAGES/DRESSINGS) ×3 IMPLANT
GLOVE BIO SURGEON STRL SZ 6.5 (GLOVE) ×2 IMPLANT
GLOVE BIO SURGEON STRL SZ8 (GLOVE) ×3 IMPLANT
GLOVE BIO SURGEONS STRL SZ 6.5 (GLOVE) ×1
GLOVE BIOGEL PI IND STRL 6.5 (GLOVE) ×1 IMPLANT
GLOVE BIOGEL PI IND STRL 8.5 (GLOVE) ×1 IMPLANT
GLOVE BIOGEL PI INDICATOR 6.5 (GLOVE) ×2
GLOVE BIOGEL PI INDICATOR 8.5 (GLOVE) ×2
GOWN STRL REUS W/ TWL LRG LVL3 (GOWN DISPOSABLE) ×1 IMPLANT
GOWN STRL REUS W/ TWL XL LVL3 (GOWN DISPOSABLE) ×1 IMPLANT
GOWN STRL REUS W/TWL LRG LVL3 (GOWN DISPOSABLE) ×2
GOWN STRL REUS W/TWL XL LVL3 (GOWN DISPOSABLE) ×2
NEEDLE HYPO 25X1 1.5 SAFETY (NEEDLE) ×3 IMPLANT
NS IRRIG 500ML POUR BTL (IV SOLUTION) ×3 IMPLANT
PACK BASIN DAY SURGERY FS (CUSTOM PROCEDURE TRAY) ×3 IMPLANT
SPONGE GAUZE 4X4 12PLY STER LF (GAUZE/BANDAGES/DRESSINGS) ×3 IMPLANT
STOCKINETTE 4X48 STRL (DRAPES) ×3 IMPLANT
SUCTION FRAZIER TIP 10 FR DISP (SUCTIONS) IMPLANT
SUT CHROMIC 5 0 P 3 (SUTURE) ×3 IMPLANT
SUT ETHILON 5 0 P 3 18 (SUTURE)
SUT MNCRL AB 3-0 PS2 18 (SUTURE) IMPLANT
SUT MNCRL AB 4-0 PS2 18 (SUTURE) IMPLANT
SUT NYLON ETHILON 5-0 P-3 1X18 (SUTURE) IMPLANT
SUT PROLENE 4 0 PS 2 18 (SUTURE) IMPLANT
SYR BULB 3OZ (MISCELLANEOUS) ×3 IMPLANT
SYR CONTROL 10ML LL (SYRINGE) ×3 IMPLANT
TOWEL OR 17X24 6PK STRL BLUE (TOWEL DISPOSABLE) ×6 IMPLANT
TRAY DSU PREP LF (CUSTOM PROCEDURE TRAY) ×3 IMPLANT
TUBE CONNECTING 12'X1/4 (SUCTIONS)
TUBE CONNECTING 12X1/4 (SUCTIONS) IMPLANT
UNDERPAD 30X30 INCONTINENT (UNDERPADS AND DIAPERS) ×3 IMPLANT

## 2014-09-02 NOTE — Transfer of Care (Signed)
Immediate Anesthesia Transfer of Care Note  Patient: Ralph Oliver  Procedure(s) Performed: Procedure(s) (LRB): LEFT THUMB DEBRIDEMENT  AND BONE CURETTAGE (Left)  Patient Location: PACU  Anesthesia Type: General  Level of Consciousness: awake, oriented, sedated and patient cooperative  Airway & Oxygen Therapy: Patient Spontanous Breathing and Patient connected to face mask oxygen  Post-op Assessment: Report given to PACU RN and Post -op Vital signs reviewed and stable  Post vital signs: Reviewed and stable  Complications: No apparent anesthesia complications

## 2014-09-02 NOTE — Anesthesia Preprocedure Evaluation (Signed)
Anesthesia Evaluation  Patient identified by MRN, date of birth, ID band Patient awake    Reviewed: Allergy & Precautions, NPO status , Patient's Chart, lab work & pertinent test results  Airway Mallampati: II  TM Distance: >3 FB Neck ROM: Full    Dental no notable dental hx.    Pulmonary neg pulmonary ROS,  breath sounds clear to auscultation  Pulmonary exam normal       Cardiovascular Rhythm:Regular Rate:Normal  H/O syncope   Neuro/Psych negative neurological ROS  negative psych ROS   GI/Hepatic Neg liver ROS, GERD-  Medicated,  Endo/Other  negative endocrine ROS  Renal/GU negative Renal ROS  negative genitourinary   Musculoskeletal negative musculoskeletal ROS (+)   Abdominal   Peds negative pediatric ROS (+)  Hematology negative hematology ROS (+)   Anesthesia Other Findings   Reproductive/Obstetrics negative OB ROS                             Anesthesia Physical Anesthesia Plan  ASA: II  Anesthesia Plan: General   Post-op Pain Management:    Induction: Intravenous  Airway Management Planned: LMA  Additional Equipment:   Intra-op Plan:   Post-operative Plan: Extubation in OR  Informed Consent: I have reviewed the patients History and Physical, chart, labs and discussed the procedure including the risks, benefits and alternatives for the proposed anesthesia with the patient or authorized representative who has indicated his/her understanding and acceptance.   Dental advisory given  Plan Discussed with: CRNA  Anesthesia Plan Comments:         Anesthesia Quick Evaluation

## 2014-09-02 NOTE — Discharge Instructions (Signed)
KEEP BANDAGE CLEAN AND DRY CALL OFFICE FOR F/U APPT [phone] IN SIX DAYS KEEP HAND ELEVATED ABOVE HEART OK TO APPLY ICE TO OPERATIVE AREA CONTACT OFFICE IF ANY WORSENING PAIN OR CONCERNS.          HAND SURGERY    HOME CARE INSTRUCTIONS    The following instructions have been prepared to help you care for yourself upon your return home today.  Wound Care:  Keep your hand elevated above the level of your heart. Do not allow it to dangle by your side. Keep the dressing dry and do not remove it unless your doctor advises you to do so. He will usually change it at the time of you post-op visit. Moving your fingers is advised to stimulate circulation but will depend on the site of your surgery. Of course, if you have a splint applied your doctor will advise you about movement.  Activity:  Do not drive or operate machinery today. Rest today and then you may return to your normal activity and work as indicated by your physician.  Diet: Drink liquids today or eat a light diet. You may resume a regular diet tomorrow.  General expectations: Pain for two or three days. Fingers may become slightly swollen.   Unexpected Observations- Call your doctor if any of these occur: Severe pain not relieved by pain medication. Elevated temperature. Dressing soaked with blood. Inability to move fingers. White or bluish color to fingers.     Post Anesthesia Home Care Instructions  Activity: Get plenty of rest for the remainder of the day. A responsible adult should stay with you for 24 hours following the procedure.  For the next 24 hours, DO NOT: -Drive a car -Advertising copywriterperate machinery -Drink alcoholic beverages -Take any medication unless instructed by your physician -Make any legal decisions or sign important papers.  Meals: Start with liquid foods such as gelatin or soup. Progress to regular foods as tolerated. Avoid greasy, spicy, heavy foods. If nausea and/or vomiting occur, drink only clear  liquids until the nausea and/or vomiting subsides. Call your physician if vomiting continues.  Special Instructions/Symptoms: Your throat may feel dry or sore from the anesthesia or the breathing tube placed in your throat during surgery. If this causes discomfort, gargle with warm salt water. The discomfort should disappear within 24 hours.

## 2014-09-02 NOTE — Anesthesia Procedure Notes (Signed)
Procedure Name: LMA Insertion Date/Time: 09/02/2014 1:57 PM Performed by: Renella CunasHAZEL, Saraphina Lauderbaugh D Pre-anesthesia Checklist: Patient identified, Emergency Drugs available, Suction available and Patient being monitored Patient Re-evaluated:Patient Re-evaluated prior to inductionOxygen Delivery Method: Circle System Utilized Preoxygenation: Pre-oxygenation with 100% oxygen Intubation Type: IV induction Ventilation: Mask ventilation without difficulty LMA: LMA inserted LMA Size: 4.0 Number of attempts: 1 Airway Equipment and Method: Bite block Placement Confirmation: positive ETCO2 Tube secured with: Tape Dental Injury: Teeth and Oropharynx as per pre-operative assessment

## 2014-09-02 NOTE — Anesthesia Postprocedure Evaluation (Signed)
  Anesthesia Post-op Note  Patient: Ralph Oliver  Procedure(s) Performed: Procedure(s) (LRB): LEFT THUMB DEBRIDEMENT  AND BONE CURETTAGE (Left)  Patient Location: PACU  Anesthesia Type: General  Level of Consciousness: awake and alert   Airway and Oxygen Therapy: Patient Spontanous Breathing  Post-op Pain: mild  Post-op Assessment: Post-op Vital signs reviewed, Patient's Cardiovascular Status Stable, Respiratory Function Stable, Patent Airway and No signs of Nausea or vomiting  Last Vitals:  Filed Vitals:   09/02/14 1530  BP: 124/81  Pulse: 94  Temp:   Resp: 16    Post-op Vital Signs: stable   Complications: No apparent anesthesia complications

## 2014-09-02 NOTE — H&P (Signed)
Marilynn Latinorent S Polimeni is an 39 y.o. male.   Chief Complaint: left thumb drainage  HPI: Pt followed in office Pt with persistent draining wound to left thumb beneath nail Pt here for surgery on left thumb No prior surgery to left thumb  Past Medical History  Diagnosis Date  . GERD (gastroesophageal reflux disease)   . Borderline hyperlipidemia   . Seasonal allergies   . History of syncope     2013-- work-up done , no idiology  . Osteomyelitis of finger     left thumb    Past Surgical History  Procedure Laterality Date  . Tonsillectomy  as child  . Transthoracic echocardiogram  10-03-2011    normal LV, ef 50-55%, mild MR, mild LAE    Family History  Problem Relation Age of Onset  . Anxiety disorder Mother   . Heart attack Father   . Cancer Father     cancer   Social History:  reports that he has never smoked. He has never used smokeless tobacco. He reports that he drinks about 6.0 oz of alcohol per week. He reports that he uses illicit drugs.  Allergies: No Known Allergies  Medications Prior to Admission  Medication Sig Dispense Refill  . omeprazole (PRILOSEC) 20 MG capsule Take 1 capsule (20 mg total) by mouth daily. (Patient taking differently: Take 20 mg by mouth every morning. ) 30 capsule 11    Results for orders placed or performed during the hospital encounter of 09/02/14 (from the past 48 hour(s))  Hemoglobin-hemacue, POC     Status: None   Collection Time: 09/02/14 12:36 PM  Result Value Ref Range   Hemoglobin 15.9 13.0 - 17.0 g/dL   No results found.  ROS NO RECENT ILLNESSES OR HOSPITALIZATIONS  Blood pressure 145/83, pulse 87, temperature 99 F (37.2 C), temperature source Oral, resp. rate 16, height 5\' 11"  (1.803 m), weight 102.059 kg (225 lb), SpO2 97 %. Physical Exam  General Appearance:  Alert, cooperative, no distress, appears stated age  Head:  Normocephalic, without obvious abnormality, atraumatic  Eyes:  Pupils equal, conjunctiva/corneas clear,        Throat: Lips, mucosa, and tongue normal; teeth and gums normal  Neck: No visible masses     Lungs:   respirations unlabored  Chest Wall:  No tenderness or deformity  Heart:  Regular rate and rhythm,  Abdomen:   Soft, non-tender,         Extremities: LEFT THUMB MILD NAIL RIDGE DEFORMITY NO DRAINAGE FINGER WARM WELL PERFUSED GOOD CAP REFIL GOOD THUMB IP FLEXION  Pulses: 2+ and symmetric  Skin: Skin color, texture, turgor normal, no rashes or lesions     Neurologic: Normal    Assessment/Plan LEFT THUMB DISTAL PHALANGEAL OSTEOMYELITIS  LEFT THUMB DEBRIDMENT AND BONE CURRETAGE AND CULTURES  R/B/A DISCUSSED WITH PT IN OFFICE.  PT VOICED UNDERSTANDING OF PLAN CONSENT SIGNED DAY OF SURGERY PT SEEN AND EXAMINED PRIOR TO OPERATIVE PROCEDURE/DAY OF SURGERY SITE MARKED. QUESTIONS ANSWERED WILL GO HOME FOLLOWING SURGERY  WE ARE PLANNING SURGERY FOR YOUR UPPER EXTREMITY. THE RISKS AND BENEFITS OF SURGERY INCLUDE BUT NOT LIMITED TO BLEEDING INFECTION, DAMAGE TO NEARBY NERVES ARTERIES TENDONS, FAILURE OF SURGERY TO ACCOMPLISH ITS INTENDED GOALS, PERSISTENT SYMPTOMS AND NEED FOR FURTHER SURGICAL INTERVENTION. WITH THIS IN MIND WE WILL PROCEED. I HAVE DISCUSSED WITH THE PATIENT THE PRE AND POSTOPERATIVE REGIMEN AND THE DOS AND DON'TS. PT VOICED UNDERSTANDING AND INFORMED CONSENT SIGNED.  Sharma CovertORTMANN,Ezinne Yogi W 09/02/2014, 1:50 PM

## 2014-09-02 NOTE — Brief Op Note (Signed)
09/02/2014  1:53 PM  PATIENT:  Ralph Oliver  39 y.o. male  PRE-OPERATIVE DIAGNOSIS:  left thumb osteomylitis  POST-OPERATIVE DIAGNOSIS:  left thumb osteomylitis  PROCEDURE:  Procedure(s): LEFT THUMB DEBRIDEMENT WOUND AND BONE DEBRIDEMENT CURETTAGE (Left)  SURGEON:  Surgeon(s) and Role:    * Sharma CovertFred W Brookelynne Dimperio, MD - Primary  PHYSICIAN ASSISTANT:   ASSISTANTS: none   ANESTHESIA:   general  EBL:     BLOOD ADMINISTERED:none  DRAINS: none   LOCAL MEDICATIONS USED:  MARCAINE     SPECIMEN:  No Specimen  DISPOSITION OF SPECIMEN:  N/A  COUNTS:  YES  TOURNIQUET:    DICTATION: .Other Dictation: Dictation Number [id number]  PLAN OF CARE: Discharge to home after PACU  PATIENT DISPOSITION:  PACU - hemodynamically stable.   Delay start of Pharmacological VTE agent (>24hrs) due to surgical blood loss or risk of bleeding: not applicable

## 2014-09-03 NOTE — Op Note (Signed)
Ralph Oliver, Ralph Oliver                  ACCOUNT NO.:  [id number]  MEDICAL RECORD NO.:  [id number]  LOCATION:                                 FACILITY:  PHYSICIAN:  Madelynn Done, MD  DATE OF BIRTH:  Sep 16, 1975  DATE OF PROCEDURE:  09/02/2014 DATE OF DISCHARGE:  09/02/2014                              OPERATIVE REPORT   PREOPERATIVE DIAGNOSIS:  Left thumb osteomyelitis.  POSTOPERATIVE DIAGNOSIS:  Left thumb osteomyelitis.  ATTENDING PHYSICIAN:  Sharma Covert IV, MD; who scrubbed and present for the entire procedure.  ASSISTANT SURGEON:  None.  ANESTHESIA:  General via LMA.  SURGICAL PROCEDURE: 1. Left thumb incision of bone cortex. 2. Left thumb bone curettage and debridement of left thumb distal     phalanx, craterization of a left thumb distal phalanx. 3. Radiographs 2 views, left thumb.  SURGICAL INDICATIONS:  The patient is a right-hand-dominant gentleman with persistent pain and drainage of a left thumb distal phalanx over the last 4 years.  The patient was seen and evaluated in the office and given the nature symptoms, it was recommended that he undergo the above procedure.  Risks, benefits, and alternatives were discussed in detail with the patient and signed informed consent was obtained.  Risks include, but not limited to bleeding, infection; damage to nearby nerves, arteries, or tendons; loss of motion of wrist and digits, incomplete relief of symptoms, and need for further surgical intervention.  DESCRIPTION OF PROCEDURE:  The patient was properly identified in the preoperative holding area and marked with a permanent marker made on left thumb to indicate correct operative site.  The patient was then brought back to the operating room, placed supine on anesthesia room table.  General anesthesia was administered.  The patient tolerated this well.  A well-padded tourniquet placed on left brachium, sealed with 1000 drape.  The left upper extremity was then  prepped and draped in normal sterile fashion.  Time-out was called, correct side was identified, and the procedure was then begun.  Attention then turned to left thumb.  The limb was then elevated and tourniquet insufflated. Careful elevation was then done with a small Therapist, nutritional.  Once this was carried out, the nail plate was then carefully removed.  The nail bed was then incised longitudinally exposing the cortex.  Incision of the cortex was then carried out of the distal phalanx.  After incision of the cortex, the bone was then opened up and using curettes of increasing size, bone curettage and craterization of the distal phalanx was then carried out.  Bone tissue was then sent for pathology.  After curettage of the distal phalanx, wound was then thoroughly irrigated. Thorough wound irrigation done.  Mini C-arm was then used to confirm placement of the curettes and help to aid in the curettage.  After thorough wound irrigation, the nail bed was then sewn back with a 5-0 chromic suture.  The nail plate was then carefully debrided and then nail cultures were then also sent.  Remainder of the nail plate was then carefully loosely reapproximated underneath the eponychium.  Thorough wound irrigation done throughout.  The tourniquet deflated.  Hemostasis was  obtained.  7 mL of 0.25% Marcaine infiltrated locally.  Sterile compressive bandage applied.  The patient tolerated the procedure well, returned to recovery room in good condition after being placed in a small finger splint.  POSTPROCEDURE PLAN:  The patient discharged to home, seen back in the office in approximately 6 days for wound check, and go over his initial cultures.  Continue to follow him clinically.  Radiographs at the first visit.     Madelynn DoneFred W Karryn Kosinski IV, MD     FWO/MEDQ  D:  09/02/2014  T:  09/03/2014  Job:  [id number]

## 2014-09-04 ENCOUNTER — Encounter (HOSPITAL_BASED_OUTPATIENT_CLINIC_OR_DEPARTMENT_OTHER): Payer: Self-pay | Admitting: Orthopedic Surgery

## 2014-09-06 LAB — TISSUE CULTURE
Culture: NO GROWTH
Gram Stain: NONE SEEN
Gram Stain: NONE SEEN

## 2014-09-06 LAB — WOUND CULTURE: Gram Stain: NONE SEEN

## 2014-09-07 LAB — ANAEROBIC CULTURE
GRAM STAIN: NONE SEEN
GRAM STAIN: NONE SEEN

## 2014-09-08 LAB — ANAEROBIC CULTURE: GRAM STAIN: NONE SEEN

## 2014-10-01 LAB — FUNGUS CULTURE W SMEAR
Fungal Smear: NONE SEEN
Fungal Smear: NONE SEEN
Fungal Smear: NONE SEEN

## 2014-10-14 ENCOUNTER — Other Ambulatory Visit: Payer: Self-pay | Admitting: Family Medicine

## 2015-01-06 ENCOUNTER — Telehealth: Payer: Self-pay | Admitting: Family Medicine

## 2015-01-06 NOTE — Telephone Encounter (Signed)
Spoke to pt. Faxed [employer] compass auth to # requested.  Pt aware he is to call us back once 6 visits are used.

## 2015-01-06 NOTE — Telephone Encounter (Signed)
Pt returned your call with the fax number to the chiropractor. Fax number [phone].  Call back number is [phone] thanks

## 2015-01-12 ENCOUNTER — Other Ambulatory Visit: Payer: Self-pay | Admitting: Family Medicine

## 2015-01-12 NOTE — Telephone Encounter (Signed)
appt scheduled and med refilled 

## 2015-01-12 NOTE — Telephone Encounter (Signed)
Please schedule f/u this summer and refill until then  

## 2015-01-12 NOTE — Telephone Encounter (Signed)
Electronic refill request, no future appt and you have not seen pt in over a year, please advise

## 2015-01-15 ENCOUNTER — Encounter: Payer: Self-pay | Admitting: Family Medicine

## 2015-01-15 ENCOUNTER — Ambulatory Visit (INDEPENDENT_AMBULATORY_CARE_PROVIDER_SITE_OTHER): Payer: No Typology Code available for payment source | Admitting: Family Medicine

## 2015-01-15 VITALS — BP 122/86 | HR 76 | Temp 98.3°F | Ht 71.0 in | Wt 218.8 lb

## 2015-01-15 DIAGNOSIS — E785 Hyperlipidemia, unspecified: Secondary | ICD-10-CM | POA: Insufficient documentation

## 2015-01-15 DIAGNOSIS — K219 Gastro-esophageal reflux disease without esophagitis: Secondary | ICD-10-CM | POA: Diagnosis not present

## 2015-01-15 DIAGNOSIS — R079 Chest pain, unspecified: Secondary | ICD-10-CM

## 2015-01-15 LAB — LIPID PANEL
CHOL/HDL RATIO: 7
Cholesterol: 207 mg/dL — ABNORMAL HIGH (ref 0–200)
HDL: 31 mg/dL — ABNORMAL LOW (ref >39.00)
LDL Cholesterol: 138 mg/dL — ABNORMAL HIGH (ref 0–99)
NonHDL: 176
TRIGLYCERIDES: 189 mg/dL — AB (ref 0.0–149.0)
VLDL: 37.8 mg/dL (ref 0.0–40.0)

## 2015-01-15 MED ORDER — OMEPRAZOLE 20 MG PO CPDR: DELAYED_RELEASE_CAPSULE | ORAL | Status: DC

## 2015-01-15 NOTE — Patient Instructions (Addendum)
Cholesterol labs today Try not to eat late before bed or drink carbonated beverages before bed  Avoid any foods that worsen reflux Continue omeprazole  Take care of yourself If chest pain at night returns and stays or worsens let me know

## 2015-01-15 NOTE — Progress Notes (Signed)
Subjective:    Patient ID: Ralph Oliver, male    DOB: Aug 02, 1975, 39 y.o.   MRN: [id number]  HPI Here for f/u of chronic problems   Wt is down 7 lb with bmi of 30  Did not really notice  Tries to eat a healthy diet   More active in the summer -working outdoors  Acid reflux is controlled with omeprazole  Feels it if he misses a dose  He can eat almost everything -occ has to avoid red sauce  Soda actually helps if he has a bout  Does not drink much coffee  occ will wake up with chest pain (3-4 times) Not sob/sweaty/nauseated  Thinks it is gas  Goes away pretty quickly - goes back to sleep  ?what makes it better   EKG today is re assuring - NSR with rate of 72 and no acute changes   Drinks very little alcohol during the week/ a little on the weekends     BP Readings from Last 3 Encounters:  01/15/15 122/86  09/02/14 129/75  04/08/14 128/70     Lab Results  Component Value Date   CHOL 194 02/24/2010   HDL 37.10* 02/24/2010   LDLDIRECT 117.1 02/24/2010   TRIG 274.0* 02/24/2010   CHOLHDL 5 02/24/2010    Is due to re check this  Borderline in the past   Patient Active Problem List   Diagnosis Date Noted  . Chest pain 01/15/2015  . Hyperlipidemia, mild 01/15/2015  . Onychomycosis 03/03/2014  . Pain of left thumb 01/06/2014  . Tinea cruris 10/22/2013  . Nail fungus 02/20/2011  . TINEA CORPORIS 02/24/2010  . GERD 02/24/2010   Past Medical History  Diagnosis Date  . GERD (gastroesophageal reflux disease)   . Borderline hyperlipidemia   . Seasonal allergies   . History of syncope     2013-- work-up done , no idiology  . Osteomyelitis of finger     left thumb   Past Surgical History  Procedure Laterality Date  . Tonsillectomy  as child  . Transthoracic echocardiogram  10-03-2011    normal LV, ef 50-55%, mild MR, mild LAE  . Incision and drainage of wound Left 09/02/2014    Procedure: LEFT THUMB DEBRIDEMENT  AND BONE CURETTAGE;  Surgeon: Sharma Covert,  MD;  Location: [facility];  Service: Orthopedics;  Laterality: Left;   History  Substance Use Topics  . Smoking status: Never Smoker   . Smokeless tobacco: Never Used  . Alcohol Use: 6.0 oz/week    10 Cans of beer per week     Comment: occ   Family History  Problem Relation Age of Onset  . Anxiety disorder Mother   . Heart attack Father   . Cancer Father     cancer   No Known Allergies No current outpatient prescriptions on file prior to visit.   No current facility-administered medications on file prior to visit.    Review of Systems Review of Systems  Constitutional: Negative for fever, appetite change, fatigue and unexpected weight change.  Eyes: Negative for pain and visual disturbance.  Respiratory: Negative for cough and shortness of breath.   Cardiovascular: Negative for palpitations   neg for PND /edema /orthopnea or sob on exertion  Gastrointestinal: Negative for nausea, diarrhea and constipation.  Genitourinary: Negative for urgency and frequency.  Skin: Negative for pallor or rash   Neurological: Negative for weakness, light-headedness, numbness and headaches.  Hematological: Negative for adenopathy. Does not bruise/bleed  easily.  Psychiatric/Behavioral: Negative for dysphoric mood. The patient is not nervous/anxious.         Objective:   Physical Exam  Constitutional: He appears well-developed and well-nourished. No distress.  obese and well appearing   HENT:  Head: Normocephalic and atraumatic.  Mouth/Throat: Oropharynx is clear and moist.  Eyes: Conjunctivae and EOM are normal. Pupils are equal, round, and reactive to light.  Neck: Normal range of motion. Neck supple. No JVD present. Carotid bruit is not present. No thyromegaly present.  Cardiovascular: Normal rate, regular rhythm, normal heart sounds and intact distal pulses.  Exam reveals no gallop.   Pulmonary/Chest: Effort normal and breath sounds normal. No respiratory distress. He has  no wheezes. He has no rales.  No crackles  Abdominal: Soft. Bowel sounds are normal. He exhibits no distension, no abdominal bruit and no mass. There is no tenderness. There is no rebound and no guarding.  Musculoskeletal: He exhibits no edema.  Lymphadenopathy:    He has no cervical adenopathy.  Neurological: He is alert. He has normal reflexes.  Skin: Skin is warm and dry. No rash noted.  Psychiatric: He has a normal mood and affect.          Assessment & Plan:   Problem List Items Addressed This Visit    Chest pain    Atypical NSR with no acute changes on EKG Rev risk factors  Suspect this is gas/ gerd - disc avoiding late eating or carbonated bev Will update if symptoms return or persist and go to ED if worse or persistent       Relevant Orders   EKG 12-Lead (Completed)   GERD - Primary    Omeprazole works well if he watches diet- disc GERD diet  Given hantout PPI refilled         Relevant Medications   omeprazole (PRILOSEC) 20 MG capsule   Hyperlipidemia, mild    Lab today  Rev low sat fat diet and exercise       Relevant Orders   Lipid panel (Completed)

## 2015-01-15 NOTE — Progress Notes (Signed)
Pre visit review using our clinic review tool, if applicable. No additional management support is needed unless otherwise documented below in the visit note. 

## 2015-01-16 NOTE — Assessment & Plan Note (Signed)
Atypical NSR with no acute changes on EKG Rev risk factors  Suspect this is gas/ gerd - disc avoiding late eating or carbonated bev Will update if symptoms return or persist and go to ED if worse or persistent

## 2015-01-16 NOTE — Assessment & Plan Note (Signed)
Omeprazole works well if he watches diet- disc GERD diet  Given hantout PPI refilled

## 2015-01-16 NOTE — Assessment & Plan Note (Signed)
Lab today  Rev low sat fat diet and exercise

## 2015-01-18 ENCOUNTER — Encounter: Payer: Self-pay | Admitting: *Deleted

## 2015-02-07 ENCOUNTER — Encounter: Payer: Self-pay | Admitting: Emergency Medicine

## 2015-02-07 ENCOUNTER — Emergency Department
Admission: EM | Admit: 2015-02-07 | Discharge: 2015-02-08 | Disposition: A | Discharge status: Home / Self Care | Payer: 59 | Attending: Emergency Medicine | Admitting: Emergency Medicine

## 2015-02-07 DIAGNOSIS — Y93G9 Activity, other involving cooking and grilling: Secondary | ICD-10-CM | POA: Insufficient documentation

## 2015-02-07 DIAGNOSIS — W290XXA Contact with powered kitchen appliance, initial encounter: Secondary | ICD-10-CM | POA: Insufficient documentation

## 2015-02-07 DIAGNOSIS — S6991XA Unspecified injury of right wrist, hand and finger(s), initial encounter: Secondary | ICD-10-CM | POA: Diagnosis present

## 2015-02-07 DIAGNOSIS — Z79899 Other long term (current) drug therapy: Secondary | ICD-10-CM | POA: Diagnosis not present

## 2015-02-07 DIAGNOSIS — S61219A Laceration without foreign body of unspecified finger without damage to nail, initial encounter: Secondary | ICD-10-CM

## 2015-02-07 DIAGNOSIS — Y9289 Other specified places as the place of occurrence of the external cause: Secondary | ICD-10-CM | POA: Insufficient documentation

## 2015-02-07 DIAGNOSIS — S61210A Laceration without foreign body of right index finger without damage to nail, initial encounter: Secondary | ICD-10-CM | POA: Diagnosis not present

## 2015-02-07 DIAGNOSIS — Y998 Other external cause status: Secondary | ICD-10-CM | POA: Insufficient documentation

## 2015-02-07 NOTE — ED Notes (Signed)
Cut on mandolin slicer.

## 2015-02-08 NOTE — Discharge Instructions (Signed)
Laceration Care, Adult °A laceration is a cut or lesion that goes through all layers of the skin and into the tissue just beneath the skin. °TREATMENT  °Some lacerations may not require closure. Some lacerations may not be able to be closed due to an increased risk of infection. It is important to see your caregiver as soon as possible after an injury to minimize the risk of infection and maximize the opportunity for successful closure. °If closure is appropriate, pain medicines may be given, if needed. The wound will be cleaned to help prevent infection. Your caregiver will use stitches (sutures), staples, wound glue (adhesive), or skin adhesive strips to repair the laceration. These tools bring the skin edges together to allow for faster healing and a better cosmetic outcome. However, all wounds will heal with a scar. Once the wound has healed, scarring can be minimized by covering the wound with sunscreen during the day for 1 full year. °HOME CARE INSTRUCTIONS  °For sutures or staples: °· Keep the wound clean and dry. °· If you were given a bandage (dressing), you should change it at least once a day. Also, change the dressing if it becomes wet or dirty, or as directed by your caregiver. °· Wash the wound with soap and water 2 times a day. Rinse the wound off with water to remove all soap. Pat the wound dry with a clean towel. °· After cleaning, apply a thin layer of the antibiotic ointment as recommended by your caregiver. This will help prevent infection and keep the dressing from sticking. °· You may shower as usual after the first 24 hours. Do not soak the wound in water until the sutures are removed. °· Only take over-the-counter or prescription medicines for pain, discomfort, or fever as directed by your caregiver. °· Get your sutures or staples removed as directed by your caregiver. °For skin adhesive strips: °· Keep the wound clean and dry. °· Do not get the skin adhesive strips wet. You may bathe  carefully, using caution to keep the wound dry. °· If the wound gets wet, pat it dry with a clean towel. °· Skin adhesive strips will fall off on their own. You may trim the strips as the wound heals. Do not remove skin adhesive strips that are still stuck to the wound. They will fall off in time. °For wound adhesive: °· You may briefly wet your wound in the shower or bath. Do not soak or scrub the wound. Do not swim. Avoid periods of heavy perspiration until the skin adhesive has fallen off on its own. After showering or bathing, gently pat the wound dry with a clean towel. °· Do not apply liquid medicine, cream medicine, or ointment medicine to your wound while the skin adhesive is in place. This may loosen the film before your wound is healed. °· If a dressing is placed over the wound, be careful not to apply tape directly over the skin adhesive. This may cause the adhesive to be pulled off before the wound is healed. °· Avoid prolonged exposure to sunlight or tanning lamps while the skin adhesive is in place. Exposure to ultraviolet light in the first year will darken the scar. °· The skin adhesive will usually remain in place for 5 to 10 days, then naturally fall off the skin. Do not pick at the adhesive film. °You may need a tetanus shot if: °· You cannot remember when you had your last tetanus shot. °· You have never had a tetanus   shot. If you get a tetanus shot, your arm may swell, get red, and feel warm to the touch. This is common and not a problem. If you need a tetanus shot and you choose not to have one, there is a rare chance of getting tetanus. Sickness from tetanus can be serious. SEEK MEDICAL CARE IF:   You have redness, swelling, or increasing pain in the wound.  You see a red line that goes away from the wound.  You have yellowish-white fluid (pus) coming from the wound.  You have a fever.  You notice a bad smell coming from the wound or dressing.  Your wound breaks open before or  after sutures have been removed.  You notice something coming out of the wound such as wood or glass.  Your wound is on your hand or foot and you cannot move a finger or toe. SEEK IMMEDIATE MEDICAL CARE IF:   Your pain is not controlled with prescribed medicine.  You have severe swelling around the wound causing pain and numbness or a change in color in your arm, hand, leg, or foot.  Your wound splits open and starts bleeding.  You have worsening numbness, weakness, or loss of function of any joint around or beyond the wound.  You develop painful lumps near the wound or on the skin anywhere on your body. MAKE SURE YOU:   Understand these instructions.  Will watch your condition.  Will get help right away if you are not doing well or get worse. Document Released: 07/17/2005 Document Revised: 10/09/2011 Document Reviewed: 01/10/2011 [facility] Patient Information 2015 [place], [place]. This information is not intended to replace advice given to you by your health care provider. Make sure you discuss any questions you have with your health care provider.  Stitches, Staples, or Skin Adhesive Strips  Stitches (sutures), staples, and skin adhesive strips hold the skin together as it heals. They will usually be in place for 7 days or less. HOME CARE  Wash your hands with soap and water before and after you touch your wound.  Only take medicine as told by your doctor.  Cover your wound only if your doctor told you to. Otherwise, leave it open to air.  Do not get your stitches wet or dirty. If they get dirty, dab them gently with a clean washcloth. Wet the washcloth with soapy water. Do not rub. Pat them dry gently.  Do not put medicine or medicated cream on your stitches unless your doctor told you to.  Do not take out your own stitches or staples. Skin adhesive strips will fall off by themselves.  Do not pick at the wound. Picking can cause an infection.  Do not miss your follow-up  appointment.  If you have problems or questions, call your doctor. GET HELP RIGHT AWAY IF:   You have a temperature by mouth above 102 F (38.9 C), not controlled by medicine.  You have chills.  You have redness or pain around your stitches.  There is puffiness (swelling) around your stitches.  You notice fluid (drainage) from your stitches.  There is a bad smell coming from your wound. MAKE SURE YOU:  Understand these instructions.  Will watch your condition.  Will get help if you are not doing well or get worse. Document Released: 05/14/2009 Document Revised: 10/09/2011 Document Reviewed: 05/14/2009 [facility] Patient Information 2015 [place], [place]. This information is not intended to replace advice given to you by your health care provider. Make sure you discuss  any questions you have with your health care provider. ° °

## 2015-02-08 NOTE — ED Provider Notes (Signed)
[facility] Emergency Department Provider Note  ____________________________________________  Time seen: Approximately 0045 AM  I have reviewed the triage vital signs and the nursing notes.   HISTORY  Chief Complaint Finger Injury   HPI Ralph Oliver is a 39 y.o. male who cut his finger cutting vegetables today. The patient reports that he was using a mandolin and sliced the tip of his right index finger. The patient reports that he was bleeding quite a bit initially but it has calmed down. The patient reports that he thinks he had his tetanus within the last 5 years. He reports the pain in his finger a 3 out of 10 in intensity.   Past Medical History  Diagnosis Date  . GERD (gastroesophageal reflux disease)   . Borderline hyperlipidemia   . Seasonal allergies   . History of syncope     2013-- work-up done , no idiology  . Osteomyelitis of finger     left thumb    Patient Active Problem List   Diagnosis Date Noted  . Chest pain 01/15/2015  . Hyperlipidemia, mild 01/15/2015  . Onychomycosis 03/03/2014  . Pain of left thumb 01/06/2014  . Tinea cruris 10/22/2013  . Nail fungus 02/20/2011  . TINEA CORPORIS 02/24/2010  . GERD 02/24/2010    Past Surgical History  Procedure Laterality Date  . Tonsillectomy  as child  . Transthoracic echocardiogram  10-03-2011    normal LV, ef 50-55%, mild MR, mild LAE  . Incision and drainage of wound Left 09/02/2014    Procedure: LEFT THUMB DEBRIDEMENT  AND BONE CURETTAGE;  Surgeon: Sharma Covert, MD;  Location: [facility] [place];  Service: Orthopedics;  Laterality: Left;    Current Outpatient Rx  Name  Route  Sig  Dispense  Refill  . omeprazole (PRILOSEC) 20 MG capsule      TAKE 1 CAPSULE (20 MG TOTAL) BY MOUTH DAILY.   30 capsule   11     Allergies Review of patient's allergies indicates no known allergies.  Family History  Problem Relation Age of Onset  . Anxiety disorder Mother   . Heart  attack Father   . Cancer Father     cancer    Social History History  Substance Use Topics  . Smoking status: Never Smoker   . Smokeless tobacco: Never Used  . Alcohol Use: 6.0 oz/week    10 Cans of beer per week     Comment: occ    Review of Systems Constitutional: No fever/chills Eyes: No visual changes. ENT: No sore throat. Cardiovascular: Denies chest pain. Respiratory: Denies shortness of breath. Gastrointestinal: No abdominal pain.  No nausea, no vomiting.  No diarrhea.  No constipation. Genitourinary: Negative for dysuria. Musculoskeletal: Negative for back pain. Skin: Right index finger laceration Neurological: Negative for headaches, focal weakness or numbness. 10-point ROS otherwise negative.  ____________________________________________   PHYSICAL EXAM:  VITAL SIGNS: ED Triage Vitals  Enc Vitals Group     BP 02/07/15 2241 135/95 mmHg     Pulse Rate 02/07/15 2241 95     Resp --      Temp 02/07/15 2241 98.7 F (37.1 C)     Temp src --      SpO2 02/07/15 2241 99 %     Weight 02/07/15 2241 210 lb (95.255 kg)     Height 02/07/15 2241 6' (1.829 m)     Head Cir --      Peak Flow --  Pain Score 02/07/15 2242 3     Pain Loc --      Pain Edu? --      Excl. in GC? --     Constitutional: Alert and oriented. Well appearing and in no acute distress. Eyes: Conjunctivae are normal. PERRL. EOMI. Head: Atraumatic. Nose: No congestion/rhinnorhea. Mouth/Throat: Mucous membranes are moist.  Oropharynx non-erythematous. Cardiovascular: Normal rate, regular rhythm. Grossly normal heart sounds.  Good peripheral circulation. Respiratory: Normal respiratory effort.  No retractions. Lungs CTAB. Gastrointestinal: Soft and nontender. No distention. No abdominal bruits. No CVA tenderness. Genitourinary: Deferred Musculoskeletal: No lower extremity tenderness nor edema.  No joint effusions. Neurologic:  Normal speech and language. No gross focal neurologic deficits are  appreciated.  Skin:  Right index finger laceration, bleeding controlled, injury appears to be a flap that is well adhered and approximated to the patient's finger. Psychiatric: Mood and affect are normal.   ____________________________________________   LABS (all labs ordered are listed, but only abnormal results are displayed)  Labs Reviewed - No data to display ____________________________________________  EKG  None ____________________________________________  RADIOLOGY  None ____________________________________________   PROCEDURES  Procedure(s) performed: Please, see procedure note(s).  LACERATION REPAIR Performed by: Lucrezia EuropeWebster,  Allison P Authorized by: Lucrezia EuropeWebster,  Allison P Consent: Verbal consent obtained. Risks and benefits: risks, benefits and alternatives were discussed Consent given by: patient Patient identity confirmed: provided demographic data Prepped and Draped in normal sterile fashion Wound explored  Laceration Location: Right index finger  Laceration Length: 2 cm  No Foreign Bodies seen or palpated  Anesthesia: None   Local anesthetic: None   Anesthetic total: None   Irrigation method: syringe Amount of cleaning: standard  Skin closure: Skin adhesive   Technique: Skin adhesive   Patient tolerance: Patient tolerated the procedure well with no immediate complications.   Critical Care performed: No  ____________________________________________   INITIAL IMPRESSION / ASSESSMENT AND PLAN / ED COURSE  Pertinent labs & imaging results that were available during my care of the patient were reviewed by me and considered in my medical decision making (see chart for details).  This is a 39 year old male who comes in today with a laceration to his right index finger. It appears as though he sliced through force of the way through the tip of his finger with a mandolin slicer creating a flap. The area is well approximated the bleeding is  controlled. I used skin glue as the area was already adhered without any spacing. The patient will be discharged home. He did not receive a tetanus shot as he reports that his last tetanus was within 5 years. ____________________________________________   FINAL CLINICAL IMPRESSION(S) / ED DIAGNOSES  Final diagnoses:  Finger laceration, initial encounter      Rebecka ApleyAllison P Webster, MD 02/08/15 [phone]

## 2015-02-08 NOTE — ED Notes (Signed)
Pt noticed not in room ATT

## 2015-02-11 ENCOUNTER — Telehealth: Payer: Self-pay

## 2015-02-11 NOTE — Telephone Encounter (Signed)
Ralph Oliver called back and advised per today's phone note. Pt voiced understanding.

## 2015-02-11 NOTE — Telephone Encounter (Signed)
Pt left v/m; ins has changed what pharmacy pt can use. Left v/m per DPR for pt that whatever pharmacy he wants to use can contact [facility] and have meds transferred that has available refills; if no refills available contact [facility].

## 2015-10-27 ENCOUNTER — Other Ambulatory Visit: Payer: Self-pay | Admitting: Family Medicine

## 2015-10-27 NOTE — Telephone Encounter (Signed)
Electronic refill request, no recent/future appt., please advise  

## 2015-10-27 NOTE — Telephone Encounter (Signed)
Please schedule f/u in July and refill until then  

## 2015-10-29 NOTE — Telephone Encounter (Signed)
appt scheduled and med refilled 

## 2016-01-03 ENCOUNTER — Encounter: Payer: Self-pay | Admitting: Primary Care

## 2016-01-03 ENCOUNTER — Ambulatory Visit (INDEPENDENT_AMBULATORY_CARE_PROVIDER_SITE_OTHER): Payer: BLUE CROSS/BLUE SHIELD | Admitting: Primary Care

## 2016-01-03 VITALS — BP 118/82 | HR 79 | Temp 98.3°F | Ht 71.0 in | Wt 220.1 lb

## 2016-01-03 DIAGNOSIS — J209 Acute bronchitis, unspecified: Secondary | ICD-10-CM

## 2016-01-03 MED ORDER — GUAIFENESIN-CODEINE 100-10 MG/5ML PO SYRP: 5.0000 mL | ORAL_SOLUTION | Freq: Every evening | ORAL | Status: DC | PRN

## 2016-01-03 MED ORDER — AZITHROMYCIN 250 MG PO TABS: ORAL_TABLET | ORAL | Status: DC

## 2016-01-03 NOTE — Progress Notes (Signed)
Pre visit review using our clinic review tool, if applicable. No additional management support is needed unless otherwise documented below in the visit note. 

## 2016-01-03 NOTE — Patient Instructions (Signed)
Start Azithromycin antibiotics. Take 2 tablets by mouth today, then 1 tablet daily for 4 additional days.  You may take the Cheratussin cough suppressant at bedtime as needed for cough, congestion, and rest. Caution this medication contains codeine and will make you feel drowsy.  Start Mucinex DM tablets for chest congestion throughout the day. Ensure you take this with a full glass of water.  Increase consumption of water intake and rest.  Please notify us if no improvement by Friday this week.  It was a pleasure meeting you!

## 2016-01-03 NOTE — Progress Notes (Signed)
Subjective:    Patient ID: Ralph Oliver, male    DOB: 05/27/1976, 40 y.o.   MRN: [id number]  HPI  Ralph Oliver is a 40 year old male who presents today with a chief complaint of cough. He also reports chest congestion, nasal congestion, headache. His symptoms have been present for the past 2-3 weeks. His cough is productive with green sputum.   He had a cold several weeks ago, felt better initially, but then began feeling worse mid last week. He's taken cough drops with some improvement in his cough. Denies sick contacts.   Review of Systems  Constitutional: Positive for fatigue. Negative for fever and chills.  HENT: Positive for congestion. Negative for ear pain, sinus pressure and sore throat.   Respiratory: Positive for cough and shortness of breath.   Musculoskeletal: Negative for myalgias.       Past Medical History  Diagnosis Date  . GERD (gastroesophageal reflux disease)   . Borderline hyperlipidemia   . Seasonal allergies   . History of syncope     2013-- work-up done , no idiology  . Osteomyelitis of finger (HCC)     left thumb     Social History   Social History  . Marital Status: Single    Spouse Name: N/A  . Number of Children: N/A  . Years of Education: N/A   Occupational History  . warehouse    Social History Main Topics  . Smoking status: Never Smoker   . Smokeless tobacco: Never Used  . Alcohol Use: 6.0 oz/week    10 Cans of beer per week     Comment: occ  . Drug Use: Yes     Comment: Marijuana - "average one joint daily"  . Sexual Activity: Not on file   Other Topics Concern  . Not on file   Social History Narrative   Regular exercise:yes-- very physical job    Past Surgical History  Procedure Laterality Date  . Tonsillectomy  as child  . Transthoracic echocardiogram  10-03-2011    normal LV, ef 50-55%, mild MR, mild LAE  . Incision and drainage of wound Left 09/02/2014    Procedure: LEFT THUMB DEBRIDEMENT  AND BONE CURETTAGE;  Surgeon: Sharma CovertFred  W Ortmann, MD;  Location: [facility] [place];  Service: Orthopedics;  Laterality: Left;    Family History  Problem Relation Age of Onset  . Anxiety disorder Mother   . Heart attack Father   . Cancer Father     cancer    No Known Allergies  Current Outpatient Prescriptions on File Prior to Visit  Medication Sig Dispense Refill  . omeprazole (PRILOSEC) 20 MG capsule TAKE ONE CAPSULE BY MOUTH DAILY 30 capsule 3   No current facility-administered medications on file prior to visit.    BP 118/82 mmHg  Pulse 79  Temp(Src) 98.3 F (36.8 C) (Oral)  Ht 5\' 11"  (1.803 m)  Wt 220 lb 1.9 oz (99.846 kg)  BMI 30.71 kg/m2  SpO2 96%    Objective:   Physical Exam  Constitutional: He appears well-nourished.  HENT:  Right Ear: Tympanic membrane and ear canal normal.  Left Ear: Tympanic membrane and ear canal normal.  Nose: No mucosal edema. Right sinus exhibits no maxillary sinus tenderness and no frontal sinus tenderness. Left sinus exhibits no maxillary sinus tenderness and no frontal sinus tenderness.  Mouth/Throat: Oropharynx is clear and moist.  Eyes: Conjunctivae are normal.  Neck: Neck supple.  Cardiovascular: Normal rate and regular rhythm.  Pulmonary/Chest: Effort normal. He has rhonchi in the right lower field and the left lower field.  Skin: Skin is warm and dry.          Assessment & Plan:  Acute Bronchitis:  Cough, chest congestion x 2-3 weeks, worse since Wednesday last week. Temporary improvement with cough drops OTC. Exam today with rhonchi to bilateral lung bases. Cough present during exam. Appears ill. Given duration of symptoms, worsening symptoms, and examination, will treat for presumed bacterial involvement. Rx for Zpak and Cheratussin provided today. Start Mucinex during the day. Increase fluids, rest, follow up PRN.

## 2016-02-08 ENCOUNTER — Ambulatory Visit (INDEPENDENT_AMBULATORY_CARE_PROVIDER_SITE_OTHER): Payer: 59 | Admitting: Family Medicine

## 2016-02-08 ENCOUNTER — Ambulatory Visit (INDEPENDENT_AMBULATORY_CARE_PROVIDER_SITE_OTHER)
Admission: RE | Admit: 2016-02-08 | Discharge: 2016-02-08 | Disposition: A | Discharge status: Home / Self Care | Payer: 59 | Source: Ambulatory Visit | Attending: Family Medicine | Admitting: Family Medicine

## 2016-02-08 ENCOUNTER — Encounter: Payer: Self-pay | Admitting: Family Medicine

## 2016-02-08 VITALS — BP 122/76 | HR 96 | Temp 98.5°F | Ht 71.0 in | Wt 222.0 lb

## 2016-02-08 DIAGNOSIS — E785 Hyperlipidemia, unspecified: Secondary | ICD-10-CM | POA: Diagnosis not present

## 2016-02-08 DIAGNOSIS — K219 Gastro-esophageal reflux disease without esophagitis: Secondary | ICD-10-CM | POA: Diagnosis not present

## 2016-02-08 DIAGNOSIS — R0789 Other chest pain: Secondary | ICD-10-CM

## 2016-02-08 DIAGNOSIS — N50819 Testicular pain, unspecified: Secondary | ICD-10-CM | POA: Diagnosis not present

## 2016-02-08 MED ORDER — OMEPRAZOLE 20 MG PO CPDR: 20.0000 mg | DELAYED_RELEASE_CAPSULE | Freq: Every day | ORAL | Status: DC

## 2016-02-08 NOTE — Assessment & Plan Note (Signed)
Pt mentions this at end of visit  Vague type of pain  No c/o of ED or urinary changes and denies exp to STD req urol referral and we made this Not examined

## 2016-02-08 NOTE — Progress Notes (Signed)
Subjective:    Patient ID: Ralph Oliver, male    DOB: [birth year], 40 y.o.   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  Daughter is 3 - a lot of fun     Wt is up 2 lb with bmi of 30 It is up 12 lb since last summer however  Could eat a little healthier -needs motivation  Has physical job for exercise   GERD- still on omeprazole 20 mg daily  As long as he takes it no symptoms  If he misses a dose or takes it late will get heartburn  Has to watch out for red sauce and hot sauce Less caffeine and soda lately  Some coffee  Alcohol intake is pretty low - a drink every few weeks (used to drink more)     Hx of hyperlipidemia Lab Results  Component Value Date   CHOL 207* 01/15/2015   HDL 31.00* 01/15/2015   LDLCALC 138* 01/15/2015   LDLDIRECT 117.1 02/24/2010   TRIG 189.0* 01/15/2015   CHOLHDL 7 01/15/2015   diet controlled Due for a check  Has not changed diet since then  occ fried foods  Eats cheese   BP Readings from Last 3 Encounters:  02/08/16 122/76  01/03/16 118/82  02/07/15 135/95     Hx of skin cancer in father  He also had back surgery  Has noticed some pain in back (R lower ribs) that radiates to the front  Had a fall years ago - ? Could have broken a rib Abdomen does not hurt  Feels it at night  No cigarettes  Some marijuana  Sometimes hurts to take a deep breath in upper back (thinks this is unrelated)  Also some pain - behind testicles  Worse with an erection  No bulges No pain to strain  No ED symptoms  No urination problems - occ frequency  Is interested in urology visit  No hx of STD Is monogamous   Patient Active Problem List   Diagnosis Date Noted  . Chest wall pain 02/08/2016  . Testicular pain 02/08/2016  . Chest pain 01/15/2015  . Hyperlipidemia, mild 01/15/2015  . Onychomycosis 03/03/2014  . Pain of  left thumb 01/06/2014  . Tinea cruris 10/22/2013  . Nail fungus 02/20/2011  . TINEA CORPORIS 02/24/2010  . GERD 02/24/2010   Past Medical History  Diagnosis Date  . GERD (gastroesophageal reflux disease)   . Borderline hyperlipidemia   . Seasonal allergies   . History of syncope     2013-- work-up done , no idiology  . Osteomyelitis of finger (HCC)     left thumb   Past Surgical History  Procedure Laterality Date  . Tonsillectomy  as child  . Transthoracic echocardiogram  10-03-2011    normal LV, ef 50-55%, mild MR, mild LAE  . Incision and drainage of wound Left 09/02/2014    Procedure: LEFT THUMB DEBRIDEMENT  AND BONE CURETTAGE;  Surgeon: Sharma Covert, MD;  Location: [facility];  Service: Orthopedics;  Laterality: Left;   Social History  Substance Use Topics  . Smoking status: Never Smoker   . Smokeless tobacco: Never Used  . Alcohol Use: 6.0 oz/week    10 Cans of beer per week     Comment: occ   Family History  Problem Relation Age of Onset  . Anxiety disorder Mother   . Heart attack Father   . Cancer Father     cancer   No Known Allergies No current outpatient prescriptions on file prior to visit.   No current facility-administered medications on file prior to visit.    Review of Systems Review of Systems  Constitutional: Negative for fever, appetite change, fatigue and unexpected weight change.  Eyes: Negative for pain and visual disturbance.  Respiratory: Negative for cough and shortness of breath.  pos for occ chest soreness with very deep breath Cardiovascular: Negative for palpitations   pos for post cw/L sided back pain  Gastrointestinal: Negative for nausea, diarrhea and constipation.  Genitourinary: Negative for urgency and frequency. Pos for intermittent vague testicular pain  Skin: Negative for pallor or rash   Neurological: Negative for weakness, light-headedness, numbness and headaches.  Hematological: Negative for adenopathy. Does  not bruise/bleed easily.  Psychiatric/Behavioral: Negative for dysphoric mood. The patient is not nervous/anxious.         Objective:   Physical Exam  Constitutional: He appears well-developed and well-nourished. No distress.  overwt and well app  HENT:  Head: Normocephalic and atraumatic.  Mouth/Throat: Oropharynx is clear and moist.  Eyes: Conjunctivae and EOM are normal. Pupils are equal, round, and reactive to light.  Neck: Normal range of motion. Neck supple. No JVD present. Carotid bruit is not present. No thyromegaly present.  Cardiovascular: Normal rate, regular rhythm, normal heart sounds and intact distal pulses.  Exam reveals no gallop.   Pulmonary/Chest: Effort normal and breath sounds normal. No respiratory distress. He has no wheezes. He has no rales. He exhibits no tenderness.  No crackles  No CW or CVA tenderness today  Abdominal: Soft. Bowel sounds are normal. He exhibits no distension, no abdominal bruit and no mass. There is no tenderness. There is no rebound and no guarding.  Genitourinary:  GU exam not done  Musculoskeletal: He exhibits no edema or tenderness.  Lymphadenopathy:    He has no cervical adenopathy.  Neurological: He is alert. He has normal reflexes.  Skin: Skin is warm and dry. No rash noted. No pallor.  Psychiatric: He has a normal mood and affect.          Assessment & Plan:   Problem List Items Addressed This Visit      Digestive   GERD - Primary    Doing well on omeprazole 20 mg daily after multiple unsuccessful attempts to get off of it Watches diet but has gained wt  No nsaids Disc imp of wt loss  I do not think we are ready to transition to H2 blocker at this time  No symptoms of ulcer  Refilled omeprazole      Relevant Medications   omeprazole (PRILOSEC) 20 MG capsule     Other   Testicular pain    Pt mentions this at end of visit  Vague type of pain  No c/o of ED or urinary changes and denies exp to STD req urol  referral and we made this Not examined       Relevant Orders   Ambulatory referral to Urology   Hyperlipidemia, mild    Due for lipid check Is fasting Disc goals for lipids and reasons to control them Rev labs with pt (last check) Rev low sat fat diet in detail - he has so far not changed diet Some CAD in family       Relevant Orders  Comprehensive metabolic panel   Lipid panel   Chest wall pain    R post lower ribs (pt had a fall in the past and wonders if he could have fx a rib) occ pain on deep breath (this is a different kind of pain)  No other symptoms  nontender on exam today cxr today      Relevant Orders   DG Chest 2 View

## 2016-02-08 NOTE — Assessment & Plan Note (Signed)
Due for lipid check Is fasting Disc goals for lipids and reasons to control them Rev labs with pt (last check) Rev low sat fat diet in detail - he has so far not changed diet Some CAD in family

## 2016-02-08 NOTE — Assessment & Plan Note (Signed)
Doing well on omeprazole 20 mg daily after multiple unsuccessful attempts to get off of it Watches diet but has gained wt  No nsaids Disc imp of wt loss  I do not think we are ready to transition to H2 blocker at this time  No symptoms of ulcer  Refilled omeprazole

## 2016-02-08 NOTE — Patient Instructions (Addendum)
For cholesterol watch fat in diet (Avoid red meat/ fried foods/ egg yolks/ fatty breakfast meats/ butter, cheese and high fat dairy/ and shellfish ) Chest xray to check ribs now  Labs for cholesterol and chemistries now   Try to take care of yourself   Stop at check out for urology referral

## 2016-02-08 NOTE — Progress Notes (Signed)
Pre visit review using our clinic review tool, if applicable. No additional management support is needed unless otherwise documented below in the visit note. 

## 2016-02-08 NOTE — Assessment & Plan Note (Signed)
R post lower ribs (pt had a fall in the past and wonders if he could have fx a rib) occ pain on deep breath (this is a different kind of pain)  No other symptoms  nontender on exam today cxr today

## 2016-02-09 LAB — COMPREHENSIVE METABOLIC PANEL
ALT: 28 U/L (ref 0–53)
AST: 23 U/L (ref 0–37)
Albumin: 4.8 g/dL (ref 3.5–5.2)
Alkaline Phosphatase: 79 U/L (ref 39–117)
BUN: 16 mg/dL (ref 6–23)
CALCIUM: 9.8 mg/dL (ref 8.4–10.5)
CHLORIDE: 105 meq/L (ref 96–112)
CO2: 27 meq/L (ref 19–32)
Creatinine, Ser: 0.94 mg/dL (ref 0.40–1.50)
GFR: 95.63 mL/min (ref >60.00)
GLUCOSE: 93 mg/dL (ref 70–99)
POTASSIUM: 3.8 meq/L (ref 3.5–5.1)
Sodium: 140 mEq/L (ref 135–145)
Total Bilirubin: 0.6 mg/dL (ref 0.2–1.2)
Total Protein: 7.7 g/dL (ref 6.0–8.3)

## 2016-02-09 LAB — LIPID PANEL
CHOL/HDL RATIO: 6
Cholesterol: 218 mg/dL — ABNORMAL HIGH (ref 0–200)
HDL: 33.9 mg/dL — AB (ref >39.00)
LDL Cholesterol: 153 mg/dL — ABNORMAL HIGH (ref 0–99)
NonHDL: 183.83
TRIGLYCERIDES: 155 mg/dL — AB (ref 0.0–149.0)
VLDL: 31 mg/dL (ref 0.0–40.0)

## 2016-02-10 ENCOUNTER — Telehealth: Payer: Self-pay | Admitting: Family Medicine

## 2016-02-10 NOTE — Telephone Encounter (Signed)
Patient called asking for Ralph Oliver to return his call.

## 2016-02-10 NOTE — Telephone Encounter (Signed)
Patient returned Shapale's call. °

## 2016-02-11 NOTE — Telephone Encounter (Signed)
Addressed through result notes  

## 2016-02-14 ENCOUNTER — Telehealth: Payer: Self-pay | Admitting: Family Medicine

## 2016-02-14 DIAGNOSIS — R109 Unspecified abdominal pain: Secondary | ICD-10-CM | POA: Insufficient documentation

## 2016-02-14 DIAGNOSIS — R0789 Other chest pain: Secondary | ICD-10-CM

## 2016-02-14 NOTE — Telephone Encounter (Signed)
xrays ordered for wed

## 2016-02-14 NOTE — Telephone Encounter (Signed)
-----   Message from Binnie Kandegina C Laws, LPN sent at 3/24/40107/17/2017  1:00 PM EDT ----- Patient notified as instructed by telephone and verbalized understanding. Patient stated that taking a deep breath may hurt a little more, but only when he is hurting anyway. Patient stated that there is not a rash and the pain feels internal. Patient stated that this has been going on for a long time and unable to come in for xray until Wednesday. Patient stated that he will come by the office late Wednesday morning or after 2:00 for xray.

## 2016-02-15 ENCOUNTER — Ambulatory Visit (INDEPENDENT_AMBULATORY_CARE_PROVIDER_SITE_OTHER): Payer: 59 | Admitting: Urology

## 2016-02-15 ENCOUNTER — Encounter: Payer: Self-pay | Admitting: Urology

## 2016-02-15 VITALS — BP 150/97 | HR 94 | Ht 70.0 in | Wt 221.0 lb

## 2016-02-15 DIAGNOSIS — N411 Chronic prostatitis: Secondary | ICD-10-CM | POA: Diagnosis not present

## 2016-02-15 DIAGNOSIS — N50819 Testicular pain, unspecified: Secondary | ICD-10-CM | POA: Diagnosis not present

## 2016-02-15 DIAGNOSIS — N4340 Spermatocele of epididymis, unspecified: Secondary | ICD-10-CM | POA: Diagnosis not present

## 2016-02-15 DIAGNOSIS — R350 Frequency of micturition: Secondary | ICD-10-CM | POA: Diagnosis not present

## 2016-02-15 LAB — URINALYSIS, COMPLETE
BILIRUBIN UA: NEGATIVE
GLUCOSE, UA: NEGATIVE
KETONES UA: NEGATIVE
Leukocytes, UA: NEGATIVE
Nitrite, UA: NEGATIVE
PROTEIN UA: NEGATIVE
RBC UA: NEGATIVE
SPEC GRAV UA: 1.025 (ref 1.005–1.030)
UUROB: 0.2 mg/dL (ref 0.2–1.0)
pH, UA: 5.5 (ref 5.0–7.5)

## 2016-02-15 LAB — MICROSCOPIC EXAMINATION
Bacteria, UA: NONE SEEN
Epithelial Cells (non renal): NONE SEEN /hpf (ref 0–10)

## 2016-02-15 NOTE — Progress Notes (Deleted)
02/15/2016 11:10 AM   Ralph Oliver 1976-04-11 [id number]  Referring provider: Judy Pimple, MD [address] [place] [address] [place], [place] [address]  Chief Complaint  Patient presents with  . Testicle Pain    New Patient    HPI: ***     PMH: Past Medical History  Diagnosis Date  . GERD (gastroesophageal reflux disease)   . Borderline hyperlipidemia   . Seasonal allergies   . History of syncope     2013-- work-up done , no idiology  . Osteomyelitis of finger (HCC)     left thumb    Surgical History: Past Surgical History  Procedure Laterality Date  . Tonsillectomy  as child  . Transthoracic echocardiogram  10-03-2011    normal LV, ef 50-55%, mild MR, mild LAE  . Incision and drainage of wound Left 09/02/2014    Procedure: LEFT THUMB DEBRIDEMENT  AND BONE CURETTAGE;  Surgeon: Sharma Covert, MD;  Location: [facility];  Service: Orthopedics;  Laterality: Left;    Home Medications:    Medication List       This list is accurate as of: 02/15/16 11:10 AM.  Always use your most recent med list.               omeprazole 20 MG capsule  Commonly known as:  PRILOSEC  Take 1 capsule (20 mg total) by mouth daily.        Allergies: No Known Allergies  Family History: Family History  Problem Relation Age of Onset  . Anxiety disorder Mother   . Heart attack Father   . Cancer Father     cancer  . Prostate cancer Paternal Uncle   . Kidney cancer Neg Hx     Social History:  reports that he has never smoked. He has never used smokeless tobacco. He reports that he drinks about 6.0 oz of alcohol per week. He reports that he uses illicit drugs (Marijuana).  ROS: UROLOGY Frequent Urination?: Yes Hard to postpone urination?: No Burning/pain with urination?: No Get up at night to urinate?: No Leakage of urine?: No Urine stream starts and stops?: No Trouble starting stream?: No Do you have to strain to urinate?: No Blood in  urine?: No Urinary tract infection?: No Sexually transmitted disease?: No Injury to kidneys or bladder?: No Painful intercourse?: No Weak stream?: No Erection problems?: No Penile pain?: No  Gastrointestinal Nausea?: No Vomiting?: No Indigestion/heartburn?: No Diarrhea?: No Constipation?: No  Constitutional Fever: No Night sweats?: No Weight loss?: No Fatigue?: No  Skin Skin rash/lesions?: No Itching?: No  Eyes Blurred vision?: No Double vision?: No  Ears/Nose/Throat Sore throat?: No Sinus problems?: No  Hematologic/Lymphatic Swollen glands?: No Easy bruising?: No  Cardiovascular Leg swelling?: No Chest pain?: No  Respiratory Cough?: No Shortness of breath?: No  Endocrine Excessive thirst?: No  Musculoskeletal Back pain?: No Joint pain?: No  Neurological Headaches?: No Dizziness?: No  Psychologic Depression?: No Anxiety?: No  Physical Exam: BP 150/97 mmHg  Pulse 94  Ht  (1.778 m)  Wt 221 lb (100.245 kg)  BMI 31.71 kg/m2  Constitutional:  Alert and oriented, No acute distress. HEENT: [place] AT, moist mucus membranes.  Trachea midline, no masses. Cardiovascular: No clubbing, cyanosis, or edema. Respiratory: Normal respiratory effort, no increased work of breathing. GI: Abdomen is soft, nontender, nondistended, no abdominal masses GU: No CVA tenderness. *** Skin: No rashes, bruises or suspicious lesions. Lymph: No cervical or inguinal adenopathy. Neurologic:  Grossly intact, no focal deficits, moving all 4 extremities. Psychiatric: Normal mood and affect.  Laboratory Data: Lab Results  Component Value Date   WBC 11.1* 04/08/2014   HGB 15.9 09/02/2014   HCT 43.9 04/08/2014   MCV 87.8 04/08/2014   PLT 284.0 04/08/2014    Lab Results  Component Value Date   CREATININE 0.94 02/08/2016     Urinalysis    Component Value Date/Time   COLORURINE yellow 06/15/2010 1134   APPEARANCEUR Clear 06/15/2010 1134   LABSPEC 1.015  06/15/2010 1134   PHURINE 7.0 06/15/2010 1134   HGBUR negative 06/15/2010 1134   BILIRUBINUR negative 06/15/2010 1134   UROBILINOGEN 0.2 06/15/2010 1134   NITRITE negative 06/15/2010 1134    Pertinent Imaging: ***  Assessment & Plan:  ***  1. Testicular pain *** - Urinalysis, Complete   No Follow-up on file.  Vanna ScotlandAshley Shalika Arntz, MD  [facility] [address], [address] [place], [place] [address] [phone]

## 2016-02-15 NOTE — Progress Notes (Signed)
02/15/2016 12:31 PM   Ralph Oliver 05/21/1976 [id number]  Referring provider: Judy PimpleMarne A Tower, MD [address] [place] [address] [place], [place] [id number]  Chief Complaint  Patient presents with  . Testicle Pain    New Patient    HPI: 40 yo male who presents today with one year history of discomfort behind his scrotum x 1 year.  He reports that this occurs exclusively when he has a firm/full erection and hurts underneath the base of the scrotum in his pelvic area. He has no difficulty with ejaculation and once the erection resolved, his pain also goes away. The pain is tolerable but consistent with each occurrence. This is been going on for approximately 1 year and he wanted to get it checked out.  He also reports today that he occasionally has some mild discomfort/aching in his right testicle following ejaculation. This has been going on for numerous years. He saw urologist in the remote past and was told he had some sort of benign mass within his right hemiscrotum. He feels that the benign lesion in his scrotum in the post ejaculatory discomfort in his right testicle are related.  He has some mild urinary symptoms including urinary frequency and urgency during the daytime. He rarely gets up at night to void. He has no dysuria or gross hematuria. No history of urinary tract infections or sexual transmitted infections.  He denies any overt history of erectile dysfunction but occasionally says that his erections aren't quite as firm as they used to be. He is able to ejaculate without difficulty which is painless.  No history of scrotal or penile trauma. He is sexually active with his wife. They're hoping to conceive another child in the near future.   PMH: Past Medical History  Diagnosis Date  . GERD (gastroesophageal reflux disease)   . Borderline hyperlipidemia   . Seasonal allergies   . History of syncope     2013-- work-up done , no idiology  . Osteomyelitis of  finger (HCC)     left thumb    Surgical History: Past Surgical History  Procedure Laterality Date  . Tonsillectomy  as child  . Transthoracic echocardiogram  10-03-2011    normal LV, ef 50-55%, mild MR, mild LAE  . Incision and drainage of wound Left 09/02/2014    Procedure: LEFT THUMB DEBRIDEMENT  AND BONE CURETTAGE;  Surgeon: Sharma CovertFred W Ortmann, MD;  [facility];  Service: Orthopedics;  Laterality: Left;    Home Medications:    Medication List       This list is accurate as of: 02/15/16 12:31 PM.  Always use your most recent med list.               omeprazole 20 MG capsule  Commonly known as:  PRILOSEC  Take 1 capsule (20 mg total) by mouth daily.        Allergies: No Known Allergies  Family History: Family History  Problem Relation Age of Onset  . Anxiety disorder Mother   . Heart attack Father   . Cancer Father     cancer  . Prostate cancer Paternal Uncle   . Kidney cancer Neg Hx     Social History:  reports that he has never smoked. He has never used smokeless tobacco. He reports that he drinks about 6.0 oz of alcohol per week. He reports that he uses illicit drugs (Marijuana).  ROS: UROLOGY Frequent Urination?: Yes Hard to postpone urination?:  No Burning/pain with urination?: No Get up at night to urinate?: No Leakage of urine?: No Urine stream starts and stops?: No Trouble starting stream?: No Do you have to strain to urinate?: No Blood in urine?: No Urinary tract infection?: No Sexually transmitted disease?: No Injury to kidneys or bladder?: No Painful intercourse?: No Weak stream?: No Erection problems?: No Penile pain?: No  Gastrointestinal Nausea?: No Vomiting?: No Indigestion/heartburn?: No Diarrhea?: No Constipation?: No  Constitutional Fever: No Night sweats?: No Weight loss?: No Fatigue?: No  Skin Skin rash/lesions?: No Itching?: No  Eyes Blurred vision?: No Double vision?:  No  Ears/Nose/Throat Sore throat?: No Sinus problems?: No  Hematologic/Lymphatic Swollen glands?: No Easy bruising?: No  Cardiovascular Leg swelling?: No Chest pain?: No  Respiratory Cough?: No Shortness of breath?: No  Endocrine Excessive thirst?: No  Musculoskeletal Back pain?: No Joint pain?: No  Neurological Headaches?: No Dizziness?: No  Psychologic Depression?: No Anxiety?: No  Physical Exam: BP 150/97 mmHg  Pulse 94  Ht  (1.778 m)  Wt 221 lb (100.245 kg)  BMI 31.71 kg/m2  Constitutional:  Alert and oriented, No acute distress. HEENT: [place] AT, moist mucus membranes.  Trachea midline, no masses. Cardiovascular: No clubbing, cyanosis, or edema. Respiratory: Normal respiratory effort, no increased work of breathing. GI: Abdomen is soft, nontender, nondistended, no abdominal masses GU: No CVA tenderness.  Normal circumcised phallus which is partially erect during examination. Tight scrotum with no skin changes. Left testicle normal bowel masses. Right testicle normal without testicular masses. There is some fullness in his right hemiscrotum adjacent to his right testicle which is slightly superior/ medial most likely consistent with spermatocele. No pain on examination. Patient lists his scrotum and points to his perineum when describing the location of his pain. Rectal exam: Normal sphincter tone. Small 20 cc prostate, nontender. No nodules. Skin: No rashes, bruises or suspicious lesions. Lymph: No cervical or inguinal adenopathy. Neurologic: Grossly intact, no focal deficits, moving all 4 extremities. Psychiatric: Normal mood and affect.  Laboratory Data: Lab Results  Component Value Date   WBC 11.1* 04/08/2014   HGB 15.9 09/02/2014   HCT 43.9 04/08/2014   MCV 87.8 04/08/2014   PLT 284.0 04/08/2014    Lab Results  Component Value Date   CREATININE 0.94 02/08/2016    Urinalysis Urinalysis today is completely negative. Microscopic exam  normal.  Pertinent Imaging: n/a  Assessment & Plan:  40 yo M with dull achy right post-ejaculatory discomfort in the right hemiscrotum along with perineal pain associated with firm erection.  1. Testicular pain Minimal bother with right total discomfort after ejaculation, chronic - Urinalysis, Complete  2. Chronic prostatitis Suspect pain in the perineum may be related to his prostate although not particularly tender on exam.  I have recommended a short course of anti-inflammatories, 3 times a day for a week and then reduced the dose as tolerated If fails to improve, consider pelvic floor therapy  3. Spermatocele Asymptomatic right spermatocele on exam, no intervention recommended  4. Urinary frequency Mild urinary frequency, may be related to prostatitis. We'll treat as above.   Return if symptoms worsen or fail to improve.  Vanna Scotland, MD  [facility] [address], [address] [place], [place] [address] [phone]

## 2016-03-02 ENCOUNTER — Encounter: Payer: Self-pay | Admitting: Urology

## 2016-03-02 ENCOUNTER — Ambulatory Visit (INDEPENDENT_AMBULATORY_CARE_PROVIDER_SITE_OTHER): Payer: 59 | Admitting: Urology

## 2016-03-02 VITALS — BP 144/84 | HR 91 | Ht 68.0 in | Wt 220.5 lb

## 2016-03-02 DIAGNOSIS — N411 Chronic prostatitis: Secondary | ICD-10-CM

## 2016-03-02 DIAGNOSIS — N50819 Testicular pain, unspecified: Secondary | ICD-10-CM

## 2016-03-02 DIAGNOSIS — R350 Frequency of micturition: Secondary | ICD-10-CM | POA: Diagnosis not present

## 2016-03-02 DIAGNOSIS — L723 Sebaceous cyst: Secondary | ICD-10-CM

## 2016-03-02 NOTE — Progress Notes (Signed)
03/02/2016 11:35 AM   Ralph Oliver 1975/09/15 [id number]  Referring provider: Judy Pimple, MD [address] [place] [address] [place], [place] [address]  Chief Complaint  Patient presents with  . Follow-up    patient noticed small lump after exam on 07/18 wants evaluated    HPI: 40 yo Caucasian male who presents today with after discovering a lump in his perineal area two days after he was seen in our office.    Background history Patient complains of a one year history of discomfort behind his scrotum x 1 year.  He reports that this occurs exclusively when he has a firm/full erection and hurts underneath the base of the scrotum in his pelvic area. He has no difficulty with ejaculation and once the erection resolved, his pain also goes away. The pain is tolerable but consistent with each occurrence. This is been going on for approximately 1 year and he wanted to get it checked out.  He also reports today that he occasionally has some mild discomfort/aching in his right testicle following ejaculation. This has been going on for numerous years. He saw urologist in the remote past and was told he had some sort of benign mass within his right hemiscrotum. He feels that the benign lesion in his scrotum in the post ejaculatory discomfort in his right testicle are related.  He has some mild urinary symptoms including urinary frequency and urgency during the daytime. He rarely gets up at night to void. He has no dysuria or gross hematuria. No history of urinary tract infections or sexual transmitted infections.  He denies any overt history of erectile dysfunction but occasionally says that his erections aren't quite as firm as they used to be. He is able to ejaculate without difficulty which is painless.  No history of scrotal or penile trauma. He is sexually active with his wife. They're hoping to conceive another child in the near future.  Today, patient states he has discovered a  lump in the perineal area behind the scrotum.  He states it was very painful upon discovery, but the pain and the size of the lump have decreased.  He has not had pain with urination or bowel movements.  He has not had fevers, chills, nausea or vomiting.  There has been no drainage from the lump.    He also states his other symptoms are still present, (urgency, frequency and pain in the right scrotum).    PMH: Past Medical History:  Diagnosis Date  . Borderline hyperlipidemia   . GERD (gastroesophageal reflux disease)   . History of syncope    2013-- work-up done , no idiology  . Osteomyelitis of finger (HCC)    left thumb  . Seasonal allergies     Surgical History: Past Surgical History:  Procedure Laterality Date  . INCISION AND DRAINAGE OF WOUND Left 09/02/2014   Procedure: LEFT THUMB DEBRIDEMENT  AND BONE CURETTAGE;  Surgeon: Sharma Covert, MD;  Location: [facility];  Service: Orthopedics;  Laterality: Left;  . TONSILLECTOMY  as child  . TRANSTHORACIC ECHOCARDIOGRAM  10-03-2011   normal LV, ef 50-55%, mild MR, mild LAE    Home Medications:    Medication List       Accurate as of 03/02/16 11:35 AM. Always use your most recent med list.          omeprazole 20 MG capsule Commonly known as:  PRILOSEC Take 1 capsule (20 mg total) by mouth  daily.       Allergies: No Known Allergies  Family History: Family History  Problem Relation Age of Onset  . Anxiety disorder Mother   . Heart attack Father   . Cancer Father     cancer  . Pancreatic cancer Paternal Uncle   . Kidney cancer Neg Hx   . Prostate cancer Neg Hx     Social History:  reports that he has quit smoking. He has never used smokeless tobacco. He reports that he drinks about 6.0 oz of alcohol per week . He reports that he uses drugs, including Marijuana.  ROS: UROLOGY Frequent Urination?: No Hard to postpone urination?: No Burning/pain with urination?: No Get up at night to urinate?:  No Leakage of urine?: No Urine stream starts and stops?: No Trouble starting stream?: No Do you have to strain to urinate?: No Blood in urine?: No Urinary tract infection?: No Sexually transmitted disease?: No Injury to kidneys or bladder?: No Painful intercourse?: No Weak stream?: No Erection problems?: No Penile pain?: No  Gastrointestinal Nausea?: No Vomiting?: No Indigestion/heartburn?: No Diarrhea?: No Constipation?: No  Constitutional Fever: No Night sweats?: No Weight loss?: No Fatigue?: No  Skin Skin rash/lesions?: No Itching?: No  Eyes Blurred vision?: No Double vision?: No  Ears/Nose/Throat Sore throat?: No Sinus problems?: No  Hematologic/Lymphatic Swollen glands?: No Easy bruising?: No  Cardiovascular Leg swelling?: No Chest pain?: No  Respiratory Cough?: No Shortness of breath?: No  Endocrine Excessive thirst?: No  Musculoskeletal Back pain?: No Joint pain?: No  Neurological Headaches?: No Dizziness?: No  Psychologic Depression?: No Anxiety?: No  Physical Exam: BP (!) 144/84   Pulse 91   Ht 5\' 8"  (1.727 m)   Wt 220 lb 8 oz (100 kg)   BMI 33.53 kg/m   Constitutional:  Alert and oriented, No acute distress. HEENT: [place] AT, moist mucus membranes.  Trachea midline, no masses. Cardiovascular: No clubbing, cyanosis, or edema. Respiratory: Normal respiratory effort, no increased work of breathing. GI: Abdomen is soft, nontender, nondistended, no abdominal masses GU: No CVA tenderness.  Normal circumcised phallus which is partially erect during examination. Tight scrotum with no skin changes. Left testicle normal bowel masses. Right testicle normal without testicular masses. There is some fullness in his right hemiscrotum adjacent to his right testicle which is slightly superior/ medial most likely consistent with spermatocele. No pain on examination. Patient lifts his scrotum and points to his perineum when describing the location of  his pain.  There is a small 2 mm x 2 mm occluded sebaceous cyst locate at the 2 o'clock position of the anus on exam.  There is no erythema, fluctuance or cellulitis.   Rectal exam: Deferred.   Skin: No rashes, bruises or suspicious lesions. Lymph: No cervical or inguinal adenopathy. Neurologic: Grossly intact, no focal deficits, moving all 4 extremities. Psychiatric: Normal mood and affect.  Laboratory Data: Lab Results  Component Value Date   WBC 11.1 (H) 04/08/2014   HGB 15.9 09/02/2014   HCT 43.9 04/08/2014   MCV 87.8 04/08/2014   PLT 284.0 04/08/2014    Lab Results  Component Value Date   CREATININE 0.94 02/08/2016    Urinalysis Urinalysis today is completely negative. Microscopic exam normal.  Pertinent Imaging: n/a  Assessment & Plan:  40 yo M with dull achy right post-ejaculatory discomfort in the right hemiscrotum along with perineal pain associated with firm erection and now a lump in the perineal area.  1. Occluded sebaceous cyst  -reassured patient that  the cyst was benign  -advised warm compresses and Sitz baths  -instructed the patient to contact the office if the cyst should get bigger or more painful  2. Testicular pain Minimal bother with right total discomfort after ejaculation, chronic   3. Chronic prostatitis Suspect pain in the perineum may be related to his prostate although not particularly tender on exam.  I have recommended a short course of anti-inflammatories, 3 times a day for a week and then reduced the dose as tolerated If fails to improve, consider pelvic floor therapy, patient would like a referral to PT at this time  4. 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 Urinary frequency Mild urinary frequency, may be related to prostatitis. We'll treat as above.   Return if symptoms worsen or fail to improve.  Michiel Cowboy, PA-C  [facility] [address], [address] [place], [place] [address] [phone]

## 2016-03-15 ENCOUNTER — Telehealth: Payer: Self-pay | Admitting: Family Medicine

## 2016-03-15 ENCOUNTER — Encounter (HOSPITAL_COMMUNITY): Payer: Self-pay | Admitting: *Deleted

## 2016-03-15 ENCOUNTER — Emergency Department (HOSPITAL_COMMUNITY)
Admission: EM | Admit: 2016-03-15 | Discharge: 2016-03-15 | Disposition: A | Discharge status: Home / Self Care | Payer: 59 | Attending: Emergency Medicine | Admitting: Emergency Medicine

## 2016-03-15 DIAGNOSIS — M542 Cervicalgia: Secondary | ICD-10-CM | POA: Insufficient documentation

## 2016-03-15 DIAGNOSIS — R51 Headache: Secondary | ICD-10-CM | POA: Insufficient documentation

## 2016-03-15 DIAGNOSIS — Z87891 Personal history of nicotine dependence: Secondary | ICD-10-CM | POA: Insufficient documentation

## 2016-03-15 DIAGNOSIS — R55 Syncope and collapse: Secondary | ICD-10-CM | POA: Diagnosis present

## 2016-03-15 DIAGNOSIS — Z5321 Procedure and treatment not carried out due to patient leaving prior to being seen by health care provider: Secondary | ICD-10-CM | POA: Insufficient documentation

## 2016-03-15 LAB — CBC
HCT: 47.1 % (ref 39.0–52.0)
Hemoglobin: 15.6 g/dL (ref 13.0–17.0)
MCH: 29 pg (ref 26.0–34.0)
MCHC: 33.1 g/dL (ref 30.0–36.0)
MCV: 87.5 fL (ref 78.0–100.0)
PLATELETS: 215 10*3/uL (ref 150–400)
RBC: 5.38 MIL/uL (ref 4.22–5.81)
RDW: 13.7 % (ref 11.5–15.5)
WBC: 5.9 10*3/uL (ref 4.0–10.5)

## 2016-03-15 LAB — BASIC METABOLIC PANEL
Anion gap: 9 (ref 5–15)
BUN: 15 mg/dL (ref 6–20)
CALCIUM: 9.5 mg/dL (ref 8.9–10.3)
CO2: 21 mmol/L — ABNORMAL LOW (ref 22–32)
Chloride: 107 mmol/L (ref 101–111)
Creatinine, Ser: 0.91 mg/dL (ref 0.61–1.24)
GFR calc Af Amer: >60 mL/min (ref >60)
GFR calc non Af Amer: >60 mL/min (ref >60)
GLUCOSE: 135 mg/dL — AB (ref 65–99)
Potassium: 3.6 mmol/L (ref 3.5–5.1)
Sodium: 137 mmol/L (ref 135–145)

## 2016-03-15 NOTE — Telephone Encounter (Signed)
Thanks for the update- I'm sorry to hear that, stay in touch and I will watch for ED notes

## 2016-03-15 NOTE — ED Triage Notes (Signed)
Patient states that he woke up with severe abdominal pain last night and went to use the restroom and then woke up on the floor. Patient reports headache and neck pain at this time. Patient denies any abdominal pain at this time. No blurred vision. GCS 15.

## 2016-03-15 NOTE — Telephone Encounter (Signed)
Pt said last night he got out of bed to go to bathroom due to stomach cramps and passed out; hit head on bed; no laceration but has knot on back of head 1 1/2 " x 1 ". Today no dizziness or vision changes but has slight h/a,neck and back are sore. Dr Milinda Antisower advised if no severe h/a or vomiting pt could pt ice on knot and schedule appt 03/16/16 but if severe h/a or vomiting pt would need to go to ED. Pt said he cannot come in on 03/16/16 due to his father being taken off life support. [facility] now.FYI to Dr Milinda Antisower.

## 2016-03-15 NOTE — Telephone Encounter (Signed)
[facility] - Client TELEPHONE ADVICE RECORD [facility] Patient Name: Ralph NewportRENT Everly DOB: 12/31/1975 Initial Comment Caller hit his head and passed out last night. Neck and back are also hurting Nurse Assessment Nurse: Debera Latalston, RN, Tinnie GensJeffrey Date/Time Lamount Cohen(Eastern Time): 03/15/2016 2:23:03 PM Confirm and document reason for call. If symptomatic, describe symptoms. You must click the next button to save text entered. ---Caller hit his head and passed out last night. Neck and back are also hurting. Has the patient traveled out of the country within the last 30 days? ---No Does the patient have any new or worsening symptoms? ---Yes Will a triage be completed? ---Yes Related visit to physician within the last 2 weeks? ---No Does the PT have any chronic conditions? (i.e. diabetes, asthma, etc.) ---No Is this a behavioral health or substance abuse call? ---No Guidelines Guideline Title Affirmed Question Affirmed Notes Head Injury [1] Knocked out (unconscious) < 1 minute AND [2] now fine Final Disposition User Go to ED Now (or PCP triage) Debera Latalston, RN, Tinnie GensJeffrey Comments Caller disconnected- reconnected and sent to C/F Spoke with office about patient refusing ED outcome and wanting to be seen in the office. Advised caller that I would check with office to see if they could potentially work him in today. Office triage nurse said that she would look into possibly scheduling him to be seen at another office and contact patient directly. Referrals GO TO FACILITY REFUSED Disagree/Comply: Comply

## 2016-03-15 NOTE — ED Notes (Signed)
Pt to desk to tell RN that he is leaving and cannot stay any longer. Pt urged to stay, but declines. Pt in NAD at this time,

## 2016-03-17 ENCOUNTER — Encounter: Payer: Self-pay | Admitting: Family Medicine

## 2016-03-17 ENCOUNTER — Ambulatory Visit (INDEPENDENT_AMBULATORY_CARE_PROVIDER_SITE_OTHER): Payer: 59 | Admitting: Family Medicine

## 2016-03-17 VITALS — BP 110/70 | HR 76 | Temp 97.8°F | Wt 217.2 lb

## 2016-03-17 DIAGNOSIS — M545 Low back pain, unspecified: Secondary | ICD-10-CM

## 2016-03-17 DIAGNOSIS — R55 Syncope and collapse: Secondary | ICD-10-CM

## 2016-03-17 DIAGNOSIS — M542 Cervicalgia: Secondary | ICD-10-CM | POA: Diagnosis not present

## 2016-03-17 NOTE — Patient Instructions (Signed)
It is unclear what caused your loss of consciousness.  If you have any further episodes, or any new, severe headache, lightheadedness/dizzines, heart racing, chest pain or shortness of breath, please go to ER.

## 2016-03-17 NOTE — Progress Notes (Signed)
Pre-visit discussion using our clinic review tool. No additional management support is needed unless otherwise documented below in the visit note.  

## 2016-03-17 NOTE — Progress Notes (Signed)
                                                                                                                                                                                                                                                                                                                                                                                                                                                                                                                                                                                                                                                                                                                                                                                                                                                                                                                                                                                                                                                                                                                                                                                                                  Subjective:    Patient ID: Ralph Oliver, male    DOB: 05/08/1976, 40 y.o.   MRN: [id number]  HPI This is a 40 yo male who presents today with a syncopal episode that occurred earlier this week. He was sleeping 4 nights ago when he awoke to go to the bathroom. He recalls that his stomach was hurting. He lost consicousness and apparently fell backward, hitting the back of his head on his bed. He hit the bottom rail of the bed and had a resulting bump on his head with neck and back pain. He doesn't think he was unconscious for long. His wife slept through the episode. A couple of nights prior to episode, he had some intermittent sharp stomach pains that he attributed to gas. They resolved spontaneously. No headaches prior to episode. No visual changes. Currently having pain at base of neck to low back. No numbness, weakness of arms or legs. No loss of bowel/bladder. Taking some advil/tylenol with some relief. Had smoked some marijuana the evening before (he does this regularly), no ETOH. Had taken an oxycodone for headache the two nights preceding the syncopal episode. No recent lightheadedness, dizziness. No difficulty sleeping. Occasional palpitations. He went to the ED two days ago and had EKG and labs; he did not stay to be seen by a provider.  His father passed away yesterday after a prolonged illness/hospitalization. He has been under a great deal of stress.   The patient had an identical episode in 2013. Cardiac work up that included holter monitor and echo did not reveal cause.   Past Medical History:  Diagnosis Date  .  Borderline hyperlipidemia   . GERD (gastroesophageal reflux disease)   . History of syncope    2013-- work-up done , no idiology  . Osteomyelitis of finger (HCC)    left thumb  . Seasonal allergies    Past Surgical History:  Procedure Laterality Date  . INCISION AND DRAINAGE OF WOUND Left 09/02/2014   Procedure: LEFT THUMB DEBRIDEMENT  AND BONE CURETTAGE;  Surgeon: Sharma CovertFred W Ortmann, MD;  [facility];  Service: Orthopedics;  Laterality: Left;  . TONSILLECTOMY  as child  . TRANSTHORACIC ECHOCARDIOGRAM  10-03-2011   normal LV, ef 50-55%, mild MR, mild LAE   Family History  Problem Relation Age of Onset  . Anxiety disorder Mother   . Heart attack Father   . Cancer Father     cancer  . Pancreatic cancer Paternal Uncle   . Kidney cancer Neg Hx   . Prostate cancer Neg Hx    Social History  Substance Use Topics  . Smoking status: Former Games developermoker  . Smokeless tobacco: Never Used     Comment: quit 20 years   . Alcohol use 6.0 oz/week    10 Cans of beer per week     Comment: occ      Review of Systems     Objective:   Physical Exam  Constitutional: He is oriented to person, place, and time. He appears well-developed and well-nourished. No distress.  HENT:  Head: Normocephalic and atraumatic.  Right Ear: External ear normal.  Left Ear: External ear normal.  Nose: Nose normal.  Mouth/Throat: Oropharynx is clear and moist. No oropharyngeal exudate.  Eyes: Conjunctivae and EOM are normal. Pupils are equal, round, and reactive to light.  Neck: Normal range of motion. Neck supple. No spinous process tenderness and no muscular tenderness present. Normal range of motion present.  Cardiovascular: Normal rate, regular  rhythm and normal heart sounds.   Pulmonary/Chest: Effort normal and breath sounds normal.  Musculoskeletal: Normal range of motion. He exhibits no edema.  Lymphadenopathy:    He has no cervical adenopathy.  Neurological: He is alert and oriented  to person, place, and time. He has normal reflexes. No cranial nerve deficit. Coordination normal.  Skin: Skin is warm and dry. He is not diaphoretic.  Psychiatric: He has a normal mood and affect. His behavior is normal. Judgment and thought content normal.  Vitals reviewed.  Labs from ER two days ago- NSR with nonspecific Twave abnormality  Results for orders placed or performed during the hospital encounter of 03/15/16  Basic metabolic panel  Result Value Ref Range   Sodium 137 135 - 145 mmol/L   Potassium 3.6 3.5 - 5.1 mmol/L   Chloride 107 101 - 111 mmol/L   CO2 21 (L) 22 - 32 mmol/L   Glucose, Bld 135 (H) 65 - 99 mg/dL   BUN 15 6 - 20 mg/dL   Creatinine, Ser 1.610.91 0.61 - 1.24 mg/dL   Calcium 9.5 8.9 - 09.610.3 mg/dL   GFR calc non Af Amer >60 >60 mL/min   GFR calc Af Amer >60 >60 mL/min   Anion gap 9 5 - 15  CBC  Result Value Ref Range   WBC 5.9 4.0 - 10.5 K/uL   RBC 5.38 4.22 - 5.81 MIL/uL   Hemoglobin 15.6 13.0 - 17.0 g/dL   HCT 04.547.1 40.939.0 - 81.152.0 %   MCV 87.5 78.0 - 100.0 fL   MCH 29.0 26.0 - 34.0 pg   MCHC 33.1 30.0 - 36.0 g/dL   RDW 91.413.7 78.211.5 - 95.615.5 %   Platelets 215 150 - 400 K/uL     BP 110/70 (BP Location: Right Arm, Patient Position: Sitting, Cuff Size: Large)   Pulse 76   Temp 97.8 F (36.6 C) (Oral)   Wt 217 lb 4 oz (98.5 kg)   SpO2 96%   BMI 33.03 kg/m  Wt Readings from Last 3 Encounters:  03/17/16 217 lb 4 oz (98.5 kg)  03/02/16 220 lb 8 oz (100 kg)  02/15/16 221 lb (100.2 kg)  Orthostatic Blood Pressure: Blood pressure:   lying 122/78, sitting 116/72, standing 110/72 Pulse:   lying 74, sitting 81, standing 81      Assessment & Plan:  Discussed with Dr. Para Marchuncan who agreed with plan 1. Syncope and collapse - discussed option of referral to cardiology for further evaluation vs. Monitoring for additional episodes. Patient prefers referral to see if cause can be determined.  - Ambulatory referral to Cardiology - RTC/ER precautions reviewed with  patient  2. Cervical pain - continue acetaminophen/ibuprofen, heat prn  3. Low back pain, midline, without sciatica - see #2 Olean Reeeborah Gessner, FNP-BC  [facility]  03/17/2016 12:44 PM

## 2016-03-28 NOTE — Progress Notes (Deleted)
Cardiology Office Note   Date:  03/28/2016   ID:  Ralph Oliver, DOB 02/08/1976, MRN [id number]  Referring Doctor:  Roxy MannsMarne Tower, MD   Cardiologist:   Almond LintAileen Jaison Petraglia, MD   Reason for consultation:  No chief complaint on file.     History of Present Illness: Ralph Oliver is a 40 y.o. male who presents for ***   ROS:  Please see the history of present illness. Aside from mentioned under HPI, all other systems are reviewed and negative.     Past Medical History:  Diagnosis Date  . Borderline hyperlipidemia   . GERD (gastroesophageal reflux disease)   . History of syncope    2013-- work-up done , no idiology  . Osteomyelitis of finger (HCC)    left thumb  . Seasonal allergies     Past Surgical History:  Procedure Laterality Date  . INCISION AND DRAINAGE OF WOUND Left 09/02/2014   Procedure: LEFT THUMB DEBRIDEMENT  AND BONE CURETTAGE;  Surgeon: Sharma CovertFred W Ortmann, MD;  [facility];  Service: Orthopedics;  Laterality: Left;  . TONSILLECTOMY  as child  . TRANSTHORACIC ECHOCARDIOGRAM  10-03-2011   normal LV, ef 50-55%, mild MR, mild LAE     reports that he has quit smoking. He has never used smokeless tobacco. He reports that he drinks about 6.0 oz of alcohol per week . He reports that he uses drugs, including Marijuana.   family history includes Anxiety disorder in his mother; Cancer in his father; Heart attack in his father; Pancreatic cancer in his paternal uncle.   Outpatient Medications Prior to Visit  Medication Sig Dispense Refill  . omeprazole (PRILOSEC) 20 MG capsule Take 1 capsule (20 mg total) by mouth daily. 30 capsule 11   No facility-administered medications prior to visit.      Allergies: Review of patient's allergies indicates no known allergies.    PHYSICAL EXAM: VS:  There were no vitals taken for this visit. , There is no height or weight on file to calculate BMI. Wt Readings from Last 3 Encounters:  03/17/16 217 lb 4 oz (98.5  kg)  03/02/16 220 lb 8 oz (100 kg)  02/15/16 221 lb (100.2 kg)    GENERAL:  well developed, well nourished, *** obese, not in acute distress HEENT: normocephalic, pink conjunctivae, anicteric sclerae, no xanthelasma, normal dentition, oropharynx clear NECK:  no neck vein engorgement, JVP normal, no hepatojugular reflux, carotid upstroke brisk and symmetric, no bruit, no thyromegaly, no lymphadenopathy LUNGS:  good respiratory effort, clear to auscultation bilaterally CV:  PMI not displaced, no thrills, no lifts, S1 and S2 within normal limits, no palpable S3 or S4, no murmurs, no rubs, no gallops ABD:  Soft, nontender, nondistended, normoactive bowel sounds, no abdominal aortic bruit, no hepatomegaly, no splenomegaly MS: nontender back, no kyphosis, no scoliosis, no joint deformities EXT:  2+ DP/PT pulses, no edema, no varicosities, no cyanosis, no clubbing SKIN: warm, nondiaphoretic, normal turgor, no ulcers NEUROPSYCH: alert, oriented to person, place, and time, sensory/motor grossly intact, normal mood, appropriate affect  Recent Labs: 02/08/2016: ALT 28 03/15/2016: BUN 15; Creatinine, Ser 0.91; Hemoglobin 15.6; Platelets 215; Potassium 3.6; Sodium 137   Lipid Panel    Component Value Date/Time   CHOL 218 (H) 02/08/2016 1631   TRIG 155.0 (H) 02/08/2016 1631   HDL 33.90 (L) 02/08/2016 1631   CHOLHDL 6 02/08/2016 1631   VLDL 31.0 02/08/2016 1631   LDLCALC 153 (H) 02/08/2016 1631   LDLDIRECT  117.1 02/24/2010 1213     Other studies Reviewed:  EKG:  The ekg from *** was personally reviewed by me and it revealed ***  Additional studies/ records that were reviewed personally reviewed by me today include: ***   ASSESSMENT AND PLAN:    Current medicines are reviewed at length with the patient today.  The patient {ACTIONS; HAS/DOES NOT HAVE:19233} concerns regarding medicines.  Labs/ tests ordered today include: No orders of the defined types were placed in this encounter.   I  had a lengthy and detailed discussion with the patient regarding diagnoses, prognosis, diagnostic options, treatment options ***, and side effects of medications.   I counseled the patient on importance of lifestyle modification including heart healthy diet, regular physical activity *** , and smoking cessation.   Disposition:   FU with undersigned after tests ***   Signed, Almond Lint, MD  03/28/2016 12:03 PM    [facility]  This note was generated in part with voice recognition software and I apologize for any typographical errors that were not detected and corrected.

## 2016-04-04 ENCOUNTER — Ambulatory Visit: Payer: 59 | Admitting: Cardiology

## 2016-04-13 ENCOUNTER — Ambulatory Visit: Payer: 59 | Admitting: Cardiology

## 2016-05-03 ENCOUNTER — Ambulatory Visit: Payer: 59 | Attending: Urology | Admitting: Physical Therapy

## 2016-05-03 ENCOUNTER — Encounter: Payer: Self-pay | Admitting: Physical Therapy

## 2016-05-03 DIAGNOSIS — R278 Other lack of coordination: Secondary | ICD-10-CM

## 2016-05-03 DIAGNOSIS — M791 Myalgia, unspecified site: Secondary | ICD-10-CM

## 2016-05-03 DIAGNOSIS — R2689 Other abnormalities of gait and mobility: Secondary | ICD-10-CM

## 2016-05-03 NOTE — Therapy (Signed)
[facility] [facility] MAIN [facility] SERVICES [address] [place], [place], [address] Phone: [phone]   Fax:  [phone]  Physical Therapy Evaluation  Patient Details  Name: Ralph Oliver MRN: [id number] Date of Birth: Oct 06, 1975 Referring Provider: Marvel Plan, MD  Encounter Date: 05/03/2016      PT End of Session - 05/03/16 2344    Visit Number 1   Number of Visits 12   Date for PT Re-Evaluation 07/25/16   PT Start Time 1100   PT Stop Time 1200   PT Time Calculation (min) 60 min   Activity Tolerance Patient tolerated treatment well;No increased pain   Behavior During Therapy WFL for tasks assessed/performed      Past Medical History:  Diagnosis Date  . Borderline hyperlipidemia   . GERD (gastroesophageal reflux disease)   . History of syncope    2013-- work-up done , no idiology.   . Osteomyelitis of finger (HCC)    left thumb  . Seasonal allergies     Past Surgical History:  Procedure Laterality Date  . INCISION AND DRAINAGE OF WOUND Left 09/02/2014   Procedure: LEFT THUMB DEBRIDEMENT  AND BONE CURETTAGE;  Surgeon: Sharma Covert, MD;  Location: [facility];  Service: Orthopedics;  Laterality: Left;  . TONSILLECTOMY  as child  . TRANSTHORACIC ECHOCARDIOGRAM  10-03-2011   normal LV, ef 50-55%, mild MR, mild LAE    There were no vitals filed for this visit.       Subjective Assessment - 05/03/16 1113    Subjective 1) Pelvic pain: Pt reported intermittent stabbing pain behind his testicles (5/10)  from 2-3 yrs that occurred with erections. Pain eases within 20 min. Denied fall to tailbone. 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  No workout routine. Pt has been informed about the presence of sebaceous cyst on R testicle. Pt reports it is the diameter of a dime/ nickel. Urologist informed pt to monitor the cyst.   2)  CLBP: off and on with repeated lifting. Currently 3/10.  non radiating.  3) pain at R lower rib     Pertinent History Denied urination nor constipation.    Patient Stated Goals releif and knowledge             [facility] PT Assessment - 05/03/16 1112      Assessment   Medical Diagnosis pelvic pain   Referring Provider McGowan, MD     Balance Screen   Has the patient fallen in the past 6 months Yes   How many times? --  1x within 6 mo.2/2 syncope waking at night to walk to toilet     Prior Function   Level of Independence Independent     Observation/Other Assessments   Other Surveys  --  NIH-CPSI  76%      Coordination   Gross Motor Movements are Fluid and Coordinated --  limited diaphragmatic excursion    Fine Motor Movements are Fluid and Coordinated --  coordination movement with pelvic floor / diaphragm      Squat   Comments poor alignment, anterior COM      Posture/Postural Control   Posture/Postural Control --  abdominal straining with pelvic floor      PROM   Overall PROM Comments R hip IR ~30 deg (pre Tx) ~45 deg (post Tx)  Palpation   SI assessment  increased paraspinal mm tensions    Palpation comment increased R posterior pelvic floor mm tensions (decreased post Tx)      Bed Mobility   Bed Mobility --  half crunch                  Pelvic Floor Special Questions - 05/03/16 1203    Diastasis Recti 4 fingers all locations along linea alba ( post Tx: 3 fingers )            [facility] Adult PT Treatment/Exercise - 05/03/16 1112      Therapeutic Activites    Therapeutic Activities --  see pt instructions; proper lifting mechanics     Manual Therapy   Manual therapy comments fascial release along oblique mm in quadriped position                PT Education - 05/03/16 1200    Education provided Yes   Education Details HEP, POC, goals, anatomy , physiology, body mechanics   Person(s) Educated Patient   Methods  Explanation;Demonstration;Tactile cues;Verbal cues;Handout   Comprehension Returned demonstration;Verbalized understanding             PT Long Term Goals - 05/03/16 1128      PT LONG TERM GOAL #1   Title Pt will be IND with testicular self exam in order to monitor sebaceous cyst and be proactive in preventive care   Time 12   Period Weeks   Status New     PT LONG TERM GOAL #2   Title Pt will demo proper lifting mechanics with 50lb without cuing in order to progress to increased lifting to minimize strain on lumbopelvic system and perform job tasks   Time 12   Period Weeks   Status New     PT LONG TERM GOAL #3   Title Pt will report decreased score on the NIH-CPSI score from   %   to < %  in order to demo improved pelvic floor function   Time 12   Period Weeks   Status New     PT LONG TERM GOAL #4   Title Pt will report a decreased pain level from 4/10 to < 2/10 with erections  < 25% of the time in order to maintain QOL    Time 12   Period Weeks   Status New               Plan - 05/03/16 2348    Clinical Impression Statement Pt is a 40 yo male who c/o pain in perineal area, low back, and R lower ribs. These deficits impact his QOL and work duties. Pt's clinical presentations include diastasis recti, poor posture, increased perineal/spinal mm tensions, limited spinal mobility, poor coordination/strength of deep core mm, and poor body mechanics with lifting. His deficits also are affected by the heavy lifting he has performed at his jobs for many years.   Following Tx today, pt showed decreased separation of rectus abdominal mm, increased R PROM hip IR, decreased R pelvic floor mm tensions/ tenderness, IND with HEP, and proper lifting mechanics/ supine to sit technique.      Rehab Potential Good   PT Frequency 1x / week   PT Duration 12 weeks   PT Treatment/Interventions ADLs/Self Care Home Management;Aquatic Therapy;Balance training;Therapeutic exercise;Therapeutic  activities;Functional mobility training;Stair training;Gait training;Neuromuscular re-education;Patient/family education;Manual techniques;Taping;Moist Heat;Cryotherapy   Consulted and Agree with Plan of Care Patient      Patient  will benefit from skilled therapeutic intervention in order to improve the following deficits and impairments:  Pain, Improper body mechanics, Postural dysfunction, Increased muscle spasms, Decreased mobility, Decreased coordination, Decreased activity tolerance, Decreased endurance, Decreased range of motion, Decreased strength, Decreased safety awareness, Impaired flexibility  Visit Diagnosis: Myalgia  Other abnormalities of gait and mobility  Other lack of coordination     Problem List Patient Active Problem List   Diagnosis Date Noted  . Right flank pain 02/14/2016  . Chest wall pain 02/08/2016  . Testicular pain 02/08/2016  . Chest pain 01/15/2015  . Hyperlipidemia, mild 01/15/2015  . Onychomycosis 03/03/2014  . Pain of left thumb 01/06/2014  . Tinea cruris 10/22/2013  . Nail fungus 02/20/2011  . TINEA CORPORIS 02/24/2010  . GERD 02/24/2010    Mariane Masters ,PT, DPT, E-RYT  05/03/2016, 11:55 PM  [facility] MAIN [facility] SERVICES [address] [place], [place], [address] Phone: [phone]   Fax:  [phone]  Name: Ralph Oliver MRN: [id number] Date of Birth: 09/26/1975

## 2016-05-03 NOTE — Patient Instructions (Signed)
Back stretches:  Laying on back: adjust to keep low back from arching 5 breaths each   Single knee to chest with R leg only this week (eventually with both legs)   Figure -4    Rocking knees side to side (feet planted hip width apart)     (Table top position)  5 reps each  childs pose rocking      table top position to high five twist and with thumb going up     Cat -cow:  Create hump with inhale, exhale, tilt tailbone, heart sinks    __________  Bonita QuinYou are now ready to begin training the deep core muscles system: diaphragm, transverse abdominis, pelvic floor . These muscles must work together as a team.       The key to these exercises to train the brain to coordinate the timing of these muscles and to have them turn on for long periods of time to hold you upright against gravity (especially important if you are on your feet all day).These muscles are postural muscles and play a role stabilizing your spine and bodyweight. By doing these repetitions slowly and correctly instead of doing crunches, you will achieve a flatter belly without a lower pooch. You are also placing your spine in a more neutral position and breathing properly which in turn, decreases your risk for problems related to your pelvic floor, abdominal, and low back such as pelvic organ prolapse, hernias, diastasis recti (separation of superficial muscles), disk herniations, spinal fractures. These exercises set a solid foundation for you to later progress to resistance/ strength training with therabands and weights and return to other typical fitness exercises with a stronger deeper core.   DO level 1 only 10 reps

## 2016-05-10 ENCOUNTER — Ambulatory Visit: Payer: 59 | Admitting: Physical Therapy

## 2016-05-10 DIAGNOSIS — M791 Myalgia, unspecified site: Secondary | ICD-10-CM

## 2016-05-10 DIAGNOSIS — R278 Other lack of coordination: Secondary | ICD-10-CM

## 2016-05-10 DIAGNOSIS — R2689 Other abnormalities of gait and mobility: Secondary | ICD-10-CM

## 2016-05-10 NOTE — Patient Instructions (Signed)
Add zigzag stretch:  Scoot hips to L, drop knees to R with pillow under thighs  5 breaths here, breathe into back ribs    __________________  You are now ready to begin training the deep core muscles system: diaphragm, transverse abdominis, pelvic floor . These muscles must work together as a team.        The key to these exercises to train the brain to coordinate the timing of these muscles and to have them turn on for long periods of time to hold you upright against gravity (especially important if you are on your feet all day).These muscles are postural muscles and play a role stabilizing your spine and bodyweight. By doing these repetitions slowly and correctly instead of doing crunches, you will achieve a flatter belly without a lower pooch. You are also placing your spine in a more neutral position and breathing properly which in turn, decreases your risk for problems related to your pelvic floor, abdominal, and low back such as pelvic organ prolapse, hernias, diastasis recti (separation of superficial muscles), disk herniations, spinal fractures. These exercises set a solid foundation for you to later progress to resistance/ strength training with therabands and weights and return to other typical fitness exercises with a stronger deeper core.  ADjust your spine before start  DO level 1 10 reps,  DO level 2 10 x reps (left and right) = 1 rep,  3 sets. 2 x day    EXhale as you lift

## 2016-05-11 NOTE — Therapy (Signed)
[facility] MAIN [facility] [address] [place], [place], [address] Phone: [phone]   Fax:  [phone]  Physical Therapy Treatment  Patient Details  Name: Ralph Oliver S Zenk MRN: [id number] Date of Birth: 11/26/1975 Referring Provider: Marvel PlanMcGowan, MD  Encounter Date: 05/10/2016      PT End of Session - 05/11/16 0644    Visit Number 2   Number of Visits 12   Date for PT Re-Evaluation 07/25/16   PT Start Time 1100   PT Stop Time 1200   PT Time Calculation (min) 60 min   Activity Tolerance Patient tolerated treatment well;No increased pain   Behavior During Therapy WFL for tasks assessed/performed      Past Medical History:  Diagnosis Date  . Borderline hyperlipidemia   . GERD (gastroesophageal reflux disease)   . History of syncope    2013-- work-up done , no idiology.   . Osteomyelitis of finger (HCC)    left thumb  . Seasonal allergies     Past Surgical History:  Procedure Laterality Date  . INCISION AND DRAINAGE OF WOUND Left 09/02/2014   Procedure: LEFT THUMB DEBRIDEMENT  AND BONE CURETTAGE;  Surgeon: Sharma CovertFred W Ortmann, MD;  [facility];  Service: Orthopedics;  Laterality: Left;  . TONSILLECTOMY  as child  . TRANSTHORACIC ECHOCARDIOGRAM  10-03-2011   normal LV, ef 50-55%, mild MR, mild LAE    There were no vitals filed for this visit.      Subjective Assessment - 05/11/16 0641    Subjective Pt reported the exercises helped to relax his back. Pt's Posterior R lower rib hurts the worst 9/10 when he wakes up in the morning for most mornings. If pt falls asleep on the R side, it is worst in the morning. Preferred sleeping position is on his belly. During the day, the pain is less but occurs when he moves in the wrong way.              [facility] - 05/11/16 0642      Observation/Other Assessments   Observations abdominal bulging with leg movements pre-Tx: and  no bulging post Tx     PROM   Overall PROM Comments B hip IR ~45 deg     Palpation   Spinal mobility Tenderness and hypomobility T12/L1 w/ reproduction of R rib pain, release of pelvic floor mm tensions.  severely increased mm tensions B lats/ quadratus mm/ paraspinals                  Pelvic Floor Special Questions - 05/11/16 0642    Diastasis Recti post Tx: 1 finger along linea Sharla Kidneyalbea            [facility] Adult PT Treatment/Exercise - 05/11/16 [id number]      Therapeutic Activites    Therapeutic Activities --  see pt instructions     Manual Therapy   Manual therapy comments STM along mm tensions at R pelvic floor mm, B paraspinals, fascial release in quadriped position to address DRA, Grade II-III along thoracic segments SP and TP.                 PT Education - 05/10/16 1206    Education provided Yes   Education Details HEP   Person(s) Educated Patient   Methods Explanation;Demonstration;Tactile cues;Verbal cues;Handout   Comprehension Returned demonstration;Verbalized understanding             PT Long Term Goals - 05/03/16 1128  PT LONG TERM GOAL #1   Title Pt will be IND with testicular self exam in order to monitor sebaceous cyst and be proactive in preventive care   Time 12   Period Weeks   Status New     PT LONG TERM GOAL #2   Title Pt will demo proper lifting mechanics with 50lb without cuing in order to progress to increased lifting to minimize strain on lumbopelvic system and perform job tasks   Time 12   Period Weeks   Status New     PT LONG TERM GOAL #3   Title Pt will report decreased score on the NIH-CPSI score from   %   to < %  in order to demo improved pelvic floor function   Time 12   Period Weeks   Status New     PT LONG TERM GOAL #4   Title Pt will report a decreased pain level from 4/10 to < 2/10 with erections  < 25% of the time in order to maintain QOL    Time 12   Period Weeks   Status New               Plan - 05/11/16 0644    Clinical  Impression Statement Suspect pt 's perineal, low back, and posterior R lower rib pain are associated to hypomobility at thoracic spine in addition to severely increased tensions in paraspinals mm bilaterally. Following Tx today, pt showed less R posterior rib pain with L spinal sidebend. Furthermore, pt's pelvic pain was also  addressed by decreasing his  abdominal separation w/ manual Tx, deep core strengthening exercises, and learning proper  body mechanics to minimize strain on his abdomen and pelvic floor mm.  Anticipate pt 's Sx will improve as he gains a better functioning system for increased  intraabdominal pressure that is needed with the repeated heavy lifting he performs at work. Plan to assess sebaceous cyst and review testicular self-exam with pt at next visit.     Rehab Potential Good   PT Frequency 1x / week   PT Duration 12 weeks   PT Treatment/Interventions ADLs/Self Care Home Management;Aquatic Therapy;Balance training;Therapeutic exercise;Therapeutic activities;Functional mobility training;Stair training;Gait training;Neuromuscular re-education;Patient/family education;Manual techniques;Taping;Moist Heat;Cryotherapy   Consulted and Agree with Plan of Care Patient      Patient will benefit from skilled therapeutic intervention in order to improve the following deficits and impairments:  Pain, Improper body mechanics, Postural dysfunction, Increased muscle spasms, Decreased mobility, Decreased coordination, Decreased activity tolerance, Decreased endurance, Decreased range of motion, Decreased strength, Decreased safety awareness, Impaired flexibility  Visit Diagnosis: Myalgia  Other abnormalities of gait and mobility  Other lack of coordination     Problem List Patient Active Problem List   Diagnosis Date Noted  . Right flank pain 02/14/2016  . Chest wall pain 02/08/2016  . Testicular pain 02/08/2016  . Chest pain 01/15/2015  . Hyperlipidemia, mild 01/15/2015  .  Onychomycosis 03/03/2014  . Pain of left thumb 01/06/2014  . Tinea cruris 10/22/2013  . Nail fungus 02/20/2011  . TINEA CORPORIS 02/24/2010  . GERD 02/24/2010    Mariane Masters ,PT, DPT, E-RYT  05/11/2016, 6:51 AM  [facility] MAIN [facility] SERVICES [address] [place], [place], [address] Phone: [phone]   Fax:  [phone]  Name: NOEMI ISHMAEL MRN: [id number] Date of Birth: [birth year]

## 2016-05-17 ENCOUNTER — Ambulatory Visit: Payer: 59 | Admitting: Physical Therapy

## 2016-05-17 DIAGNOSIS — M791 Myalgia, unspecified site: Secondary | ICD-10-CM

## 2016-05-17 DIAGNOSIS — R278 Other lack of coordination: Secondary | ICD-10-CM

## 2016-05-17 DIAGNOSIS — R2689 Other abnormalities of gait and mobility: Secondary | ICD-10-CM

## 2016-05-17 NOTE — Patient Instructions (Addendum)
Mini low cobras:  Toes turned each other palms under armpit, elbows by ribs (like cricket wings),  imaginary pencil held in armpit,   Inhale,  Exhale press through palms to bed/floor and small lift of chest only not the chin. You will feel the bend in the midback not your low back   5 reps    MINI COBRAS in STANDING WHILE AT WORK     _________________   Testicular self-exam (handout)    _________________  Self-release on deep perineal transverse muscle from pubic rami to the center (both sides) to relax the area near the perineum Continue to lengthen pelvic floor with inhale and natural lift with exhale

## 2016-05-18 NOTE — Therapy (Addendum)
[facility] MAIN [facility] SERVICES [address] [place], [place], [address] Phone: [phone]   Fax:  [phone]  Physical Therapy Treatment  Patient Details  Name: Ralph Oliver MRN: [id number] Date of Birth: May 05, 1976 Referring Provider: Marvel Plan, MD  Encounter Date: 05/17/2016      PT End of Session - 05/18/16 1038    Visit Number 3   Number of Visits 12   Date for PT Re-Evaluation 07/25/16   PT Start Time 1105   PT Stop Time 1210   PT Time Calculation (min) 65 min   Activity Tolerance Patient tolerated treatment well;No increased pain   Behavior During Therapy WFL for tasks assessed/performed      Past Medical History:  Diagnosis Date  . Borderline hyperlipidemia   . GERD (gastroesophageal reflux disease)   . History of syncope    2013-- work-up done , no idiology.   . Osteomyelitis of finger (HCC)    left thumb  . Seasonal allergies     Past Surgical History:  Procedure Laterality Date  . INCISION AND DRAINAGE OF WOUND Left 09/02/2014   Procedure: LEFT THUMB DEBRIDEMENT  AND BONE CURETTAGE;  Surgeon: Sharma Covert, MD;  Location: [facility];  Service: Orthopedics;  Laterality: Left;  . TONSILLECTOMY  as child  . TRANSTHORACIC ECHOCARDIOGRAM  10-03-2011   normal LV, ef 50-55%, mild MR, mild LAE    There were no vitals filed for this visit.      Subjective Assessment - 05/17/16 1114    Subjective Pt reported the R posterior R rib feels a little better and is down from a 9/10 to 6/10.  Pt has been sleeping on his back and L side instead of on the stomach and R sidelying. Pelvic pain has improved with erections by 40%.              [facility] PT Assessment - 05/18/16 1036      Palpation   Spinal mobility Decreased tensions along paraspinals B, slight hypomobility along T3   decreased report of p! on R lower rib compared to last sessi                  Pelvic Floor Special Questions - 05/18/16  1030    External Perineal Exam pt consented verbally    External Palpation He reported he previously had located at the anterolateral area of R testicle but pt was unable to locate the cyst today in both hooklying and standing positions.   No nodules were noted on R testicle with palpation by PT when pt was in hooklying and standing positions.  PT verbalized the instructions for the testicular self-examination technique while pt performed on self. Pt voiced understanding.  PT palpated slight increased mm tensions at perineum (L>R at intersection od deep transverse perineal/ bulbospongiousus) with report of tenderness (concordant sign) but tenderness and tensions decreased with mobilization technique across deep transverse perineal mm L and hip IR . Pt was able to perform self-mobilization technique with verbal instructions by PT and use of anatomical images. Previous mm tensions from first/second visit were significantly decreased.              [facility] Adult PT Treatment/Exercise - 05/18/16 1037      Therapeutic Activites    Therapeutic Activities --  see pt instructions     Manual Therapy   Manual therapy comments Grade III PA along T3, STM along paraspinals  PT Education - 05/18/16 1037    Education provided Yes   Education Details HEP, instructions on testicular self-exam to be performed once a month for health prevention   Person(s) Educated Patient   Methods Explanation;Demonstration;Tactile cues;Verbal cues;Handout   Comprehension Verbalized understanding;Returned demonstration;Verbal cues required;Tactile cues required             PT Long Term Goals - 05/03/16 1128      PT LONG TERM GOAL #1   Title Pt will be IND with testicular self exam in order to monitor sebaceous cyst and be proactive in preventive care   Time 12   Period Weeks   Status Achieved     PT LONG TERM GOAL #2   Title Pt will demo proper lifting mechanics with 50lb without cuing in  order to progress to increased lifting to minimize strain on lumbopelvic system and perform job tasks   Time 12   Period Weeks   Status On going     PT LONG TERM GOAL #3   Title Pt will report decreased score on the NIH-CPSI score from   %   to < %  in order to demo improved pelvic floor function   Time 12   Period Weeks   Status On going     PT LONG TERM GOAL #4   Title Pt will report a decreased pain level from 4/10 to < 2/10 with erections  < 25% of the time in order to maintain QOL    Time 12   Period Weeks   Status On going               Plan - 05/18/16 1038    Clinical Impression Statement Pt reports that his pelvic pain has improved by 40%  and R posterior R rib pain decreased by 9/10 to 6/10. Suspect pt's hypomobility at thoracic spine compromised function of iliohypogastric/ ilioinguinal nerves that innervate into the abdominopelvic area. Additional possible contributing factors to his Sx include diastasis recti and increased paraspinal mm tensions due to excessive heavy lifting and poor body mechanics which decreased his intraabdominal pressure system for proper postural stability in a loaded task.   Today, his thoracic spine showed increased mobility compared to previous sessions and pt demo'd new stretches/ movement techniques correctly which were customized to minimize relapse of thoracic hypomoility. Pt also showed significantly decreased pelvic floor mm tensions with improved hip mobility.  Pt was educated on the technique for testicular self-exam. Today during self-palpation, pt was not able to locate any nodule in his R testicle where pt reported he once had noticed a nodule was located prior to [facility]. PT was not able to locate any nodules as well through palpation.  Pt voiced and demo'd understanding to perform monthly testicular exam as a preventation practice. Pt pointed to the area in the perineum where he reported he has experienced pain during erections in the  past. PT noted slightly increased tenderness/tensions at deep transverse perineal near the intersection with bulbospongiosus (L) and the tensions/tenderness decreased following manual Tx today. Pt demo'd IND with self-mobilization technique. PT spoke with his urologist re: his sebaceous cyst located near the perineum and she verified that there are no contraindications to performing soft-tissue mobilization technique in that area.  Anticipate pt will continue to progress towards his goals because he shows compliance to HEP which is designed to  decrease his diastasis recti, paraspinal/pelvic floor mm tensions .  Plan to adddress lifting mechanics and incorporating well-rounded weight  training routine to counter overuse of back/pelvic mm when lifting at work.     Rehab Potential Good   PT Frequency 1x / week   PT Duration 12 weeks   PT Treatment/Interventions ADLs/Self Care Home Management;Aquatic Therapy;Balance training;Therapeutic exercise;Therapeutic activities;Functional mobility training;Stair training;Gait training;Neuromuscular re-education;Patient/family education;Manual techniques;Taping;Moist Heat;Cryotherapy   Consulted and Agree with Plan of Care Patient      Patient will benefit from skilled therapeutic intervention in order to improve the following deficits and impairments:  Pain, Improper body mechanics, Postural dysfunction, Increased muscle spasms, Decreased mobility, Decreased coordination, Decreased activity tolerance, Decreased endurance, Decreased range of motion, Decreased strength, Decreased safety awareness, Impaired flexibility  Visit Diagnosis: Myalgia  Other abnormalities of gait and mobility  Other lack of coordination     Problem List Patient Active Problem List   Diagnosis Date Noted  . Right flank pain 02/14/2016  . Chest wall pain 02/08/2016  . Testicular pain 02/08/2016  . Chest pain 01/15/2015  . Hyperlipidemia, mild 01/15/2015  . Onychomycosis  03/03/2014  . Pain of left thumb 01/06/2014  . Tinea cruris 10/22/2013  . Nail fungus 02/20/2011  . TINEA CORPORIS 02/24/2010  . GERD 02/24/2010    Mariane MastersYeung,Shin Yiing ,PT, DPT, E-RYT  05/18/2016, 10:46 AM  [facility] [address] [place], [place], [address] Phone: [phone]   Fax:  [phone]  Name: Ralph Oliver MRN: [id number] Date of Birth: 06/30/1976

## 2016-05-24 ENCOUNTER — Ambulatory Visit: Payer: 59 | Admitting: Physical Therapy

## 2016-06-07 ENCOUNTER — Encounter: Payer: Self-pay | Admitting: Cardiology

## 2016-06-07 ENCOUNTER — Ambulatory Visit (INDEPENDENT_AMBULATORY_CARE_PROVIDER_SITE_OTHER): Payer: 59 | Admitting: Cardiology

## 2016-06-07 ENCOUNTER — Ambulatory Visit (INDEPENDENT_AMBULATORY_CARE_PROVIDER_SITE_OTHER): Payer: 59

## 2016-06-07 ENCOUNTER — Encounter (INDEPENDENT_AMBULATORY_CARE_PROVIDER_SITE_OTHER): Payer: Self-pay

## 2016-06-07 ENCOUNTER — Encounter: Payer: 59 | Admitting: Physical Therapy

## 2016-06-07 VITALS — BP 132/88 | HR 79 | Ht 68.0 in | Wt 228.4 lb

## 2016-06-07 DIAGNOSIS — R55 Syncope and collapse: Secondary | ICD-10-CM

## 2016-06-07 DIAGNOSIS — R0602 Shortness of breath: Secondary | ICD-10-CM | POA: Diagnosis not present

## 2016-06-07 DIAGNOSIS — R002 Palpitations: Secondary | ICD-10-CM

## 2016-06-07 NOTE — Progress Notes (Addendum)
Cardiology Office Note   Date:  06/07/2016   ID:  Ralph Latinorent S Fife, DOB 01/02/1976, MRN [id number]  Referring Doctor:  Roxy MannsMarne Tower, MD   Cardiologist:   Almond LintAileen Jordan Pardini, MD   Reason for consultation:  Chief Complaint  Patient presents with  . New Patient (Initial Visit)    Syncope/ last episode Aug 2017      History of Present Illness: Ralph Oliver is a 40 y.o. male who presents for Episode of loss of consciousness. This event was back in August 2017. Patient reports that he was sleeping and was woken up by a sensation of cramping in his abdomen. He thought he needed to go to use the bathroom and therefore stood up and walked to the bathroom. this was in the middle of early morning. He remembers walking to the bathroom door and the next thing he remembers is waking up on the floor with a small bump on his backside of the head. There were no witnesses as the wife was sleeping in the living room. He did not seek emergency medical attention at that time time. He went back to bed. He did not recall having any symptoms of chest pain, shortness of breath, palpitations, lightheadedness. He called his PCP in the morning and was told to go to the ER. He waited several hours in the ER and decided to leave before being seen.  He recalls a similar episode 4 years ago. Again, waking up in the middle of the night to go to the bathroom and falling to the bathroom floor and sustaining some injury. He was unaware how he fell to the bathroom. He did not have any symptoms prior to the episode. He was seen by Dr. Mariah MillingGollan at the time. Echocardiogram, Holter monitor were done and were unremarkable. Patient was advised to come back if he had any further episodes.   Patient reports that his work requires a lot of physical activity. 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 The symptoms are recent in onset, moderate in intensity. Resolving with rest. He also has been noting  palpitations with exertion, more than the usual increased heart rate he thinks is in proportion to the level of activity.  Patient denies PND, orthopnea, edema. No abdominal pain.   ROS:  Please see the history of present illness. Aside from mentioned under HPI, all other systems are reviewed and negative.     Past Medical History:  Diagnosis Date  . Borderline hyperlipidemia   . GERD (gastroesophageal reflux disease)   . History of syncope    2013-- work-up done , no idiology.   . Osteomyelitis of finger (HCC)    left thumb  . Seasonal allergies   . Syncope and collapse     Past Surgical History:  Procedure Laterality Date  . INCISION AND DRAINAGE OF WOUND Left 09/02/2014   Procedure: LEFT THUMB DEBRIDEMENT  AND BONE CURETTAGE;  Surgeon: Sharma CovertFred W Ortmann, MD;  [facility] ;  Service: Orthopedics;  Laterality: Left;  . TONSILLECTOMY  as child  . TRANSTHORACIC ECHOCARDIOGRAM  10-03-2011   normal LV, ef 50-55%, mild MR, mild LAE     reports that he has quit smoking. He has never used smokeless tobacco. He reports that he drinks about 6.0 oz of alcohol per week . He reports that he uses drugs, including Marijuana.   family history includes Anxiety disorder in his mother; Cancer in his father; Heart attack in  his father; Pancreatic cancer in his paternal uncle.   Outpatient Medications Prior to Visit  Medication Sig Dispense Refill  . omeprazole (PRILOSEC) 20 MG capsule Take 1 capsule (20 mg total) by mouth daily. 30 capsule 11   No facility-administered medications prior to visit.      Allergies: Patient has no known allergies.    PHYSICAL EXAM: VS:  BP 132/88   Pulse 79   Ht 5\' 8"  (1.727 m)   Wt 228 lb 6.4 oz (103.6 kg)   BMI 34.73 kg/m  , Body mass index is 34.73 kg/m. Wt Readings from Last 3 Encounters:  06/07/16 228 lb 6.4 oz (103.6 kg)  03/17/16 217 lb 4 oz (98.5 kg)  03/02/16 220 lb 8 oz (100 kg)    GENERAL:  well developed, well  nourished, obese, not in acute distress HEENT: normocephalic, pink conjunctivae, anicteric sclerae, no xanthelasma, normal dentition, oropharynx clear NECK:  no neck vein engorgement, JVP normal, no hepatojugular reflux, carotid upstroke brisk and symmetric, no bruit, no thyromegaly, no lymphadenopathy LUNGS:  good respiratory effort, clear to auscultation bilaterally CV:  PMI not displaced, no thrills, no lifts, S1 and S2 within normal limits, no palpable S3 or S4, no murmurs, no rubs, no gallops ABD:  Soft, nontender, nondistended, normoactive bowel sounds, no abdominal aortic bruit, no hepatomegaly, no splenomegaly MS: nontender back, no kyphosis, no scoliosis, no joint deformities EXT:  2+ DP/PT pulses, no edema, no varicosities, no cyanosis, no clubbing SKIN: warm, nondiaphoretic, normal turgor, no ulcers NEUROPSYCH: alert, oriented to person, place, and time, sensory/motor grossly intact, normal mood, appropriate affect  Recent Labs: 02/08/2016: ALT 28 03/15/2016: BUN 15; Creatinine, Ser 0.91; Hemoglobin 15.6; Platelets 215; Potassium 3.6; Sodium 137   Lipid Panel    Component Value Date/Time   CHOL 218 (H) 02/08/2016 1631   TRIG 155.0 (H) 02/08/2016 1631   HDL 33.90 (L) 02/08/2016 1631   CHOLHDL 6 02/08/2016 1631   VLDL 31.0 02/08/2016 1631   LDLCALC 153 (H) 02/08/2016 1631   LDLDIRECT 117.1 02/24/2010 1213     Other studies Reviewed:  EKG:  The ekg from 06/07/2016 was personally reviewed by me and it revealed sinus rhythm, 79 BPM, nonspecific T-wave changes.  Additional studies/ records that were reviewed personally reviewed by me today include:  Echocardiogram 10/03/2011: Left ventricle: The cavity size was mildly dilated. Wall thickness was normal. Systolic function was normal. The estimated ejection fraction was in the range of 50% to 55%. Wall motion was normal; there were no regional wall motion abnormalities. Left ventricular diastolic function parameters  were normal. - Mitral valve: Mild regurgitation. - Left atrium: The atrium was mildly dilated. - Right ventricle: Systolic function was normal. - Pulmonary arteries: Systolic pressure was within the normal range.   ASSESSMENT AND PLAN: Loss of consciousness Per history, most likely consistent with orthostatic drop in his blood pressure or component of vasovagal syncope. We'll recommend to rule out significant cardiac issues with echocardiogram, and event monitor.   Symptoms of shortness of breath Nonspecific changes on the EKG palpitations Recommend stress echocardiogram and event monitor.   Current medicines are reviewed at length with the patient today.  The patient does not have concerns regarding medicines.  Labs/ tests ordered today include: No orders of the defined types were placed in this encounter.   I had a lengthy and detailed discussion with the patient regarding diagnoses, prognosis, diagnostic options, treatment options , and side effects of medications.   I counseled the patient on  importance of lifestyle modification including heart healthy diet, regular physical activity  , and smoking cessation.   Disposition:   FU with undersigned after tests   Signed, Almond LintAileen Bonetta Mostek, MD  06/07/2016 11:47 AM    [facility]  This note was generated in part with voice recognition software and I apologize for any typographical errors that were not detected and corrected.

## 2016-06-07 NOTE — Patient Instructions (Addendum)
Testing/Procedures:  Your physician has recommended that you wear a holter monitor. Holter monitors are medical devices that record the heart's electrical activity. Doctors most often use these monitors to diagnose arrhythmias. Arrhythmias are problems with the speed or rhythm of the heartbeat. The monitor is a small, portable device. You can wear one while you do your normal daily activities. This is usually used to diagnose what is causing palpitations/syncope (passing out).   Zio monitor placed [id number]   Your physician has requested that you have an echocardiogram. Echocardiography is a painless test that uses sound waves to create images of your heart. It provides your doctor with information about the size and shape of your heart and how well your heart's chambers and valves are working. This procedure takes approximately one hour. There are no restrictions for this procedure.  Your physician has requested that you have a stress echocardiogram. For further information please visit [email or web address]. Please follow instruction sheet as given.   Do not drink or eat foods with caffeine for 24 hours before the test. (Chocolate, coffee, tea, or energy drinks)  If you use an inhaler, bring it with you to the test.  Do not smoke for 4 hours before the test.  Wear comfortable shoes and clothing.   Follow-Up: Your physician recommends that you schedule a follow-up appointment after testing with Dr. Alvino ChapelIngal.  It was a pleasure seeing you today here in the office. Please do not hesitate to give us a call back if you have any further questions. [phone]  Mullica Hill CellarPamela A. RN, BSN     Echocardiogram An echocardiogram, or echocardiography, uses sound waves (ultrasound) to produce an image of your heart. The echocardiogram is simple, painless, obtained within a short period of time, and offers valuable information to your health care provider. The images from an echocardiogram can provide  information such as:  Evidence of coronary artery disease (CAD).  Heart size.  Heart muscle function.  Heart valve function.  Aneurysm detection.  Evidence of a past heart attack.  Fluid buildup around the heart.  Heart muscle thickening.  Assess heart valve function. LET [facility] PROVIDER KNOW ABOUT:  Any allergies you have.  All medicines you are taking, including vitamins, herbs, eye drops, creams, and over-the-counter medicines.  Previous problems you or members of your family have had with the use of anesthetics.  Any blood disorders you have.  Previous surgeries you have had.  Medical conditions you have.  Possibility of pregnancy, if this applies. BEFORE THE PROCEDURE  No special preparation is needed. Eat and drink normally.  PROCEDURE   In order to produce an image of your heart, gel will be applied to your chest and a wand-like tool (transducer) will be moved over your chest. The gel will help transmit the sound waves from the transducer. The sound waves will harmlessly bounce off your heart to allow the heart images to be captured in real-time motion. These images will then be recorded.  You may need an IV to receive a medicine that improves the quality of the pictures. AFTER THE PROCEDURE You may return to your normal schedule including diet, activities, and medicines, unless your health care provider tells you otherwise.   This information is not intended to replace advice given to you by your health care provider. Make sure you discuss any questions you have with your health care provider.   Document Released: 07/14/2000 Document Revised: 08/07/2014 Document Reviewed: 03/24/2013 Elsevier Interactive Patient Education 2016 [employer] Exercise  Stress Echocardiogram An exercise stress echocardiogram is a heart (cardiac) test used to check the function of your heart. This test may also be called an exercise stress echocardiography or stress echo.  This stress test will check how well your heart muscle and valves are working and determine if your heart muscle is getting enough blood. You will exercise on a treadmill to naturally increase or stress the functioning of your heart.  An echocardiogram uses sound waves (ultrasound) to produce an image of your heart. If your heart does not work normally, it may indicate coronary artery disease with poor coronary blood supply. The coronary arteries are the arteries that bring blood and oxygen to your heart. LET [facility] CARE PROVIDER KNOW ABOUT:  Any allergies you have.  All medicines you are taking, including vitamins, herbs, eye drops, creams, and over-the-counter medicines.  Previous problems you or members of your family have had with the use of anesthetics.  Any blood disorders you have.  Previous surgeries you have had.  Medical conditions you have.  Possibility of pregnancy, if this applies. RISKS AND COMPLICATIONS Generally, this is a safe procedure. However, as with any procedure, complications can occur. Possible complications can include:  You develop pain or pressure in the following areas:  Chest.  Jaw or neck.  Between your shoulder blades.  Radiating down your left arm.  Dizziness or lightheadedness.  Shortness of breath.  Increased or irregular heartbeat.  Nausea or vomiting.  Heart attack (rare). BEFORE THE PROCEDURE  Avoid all forms of caffeine for 24 hours before your test or as directed by your health care provider. This includes coffee, tea (even decaffeinated tea), caffeinated sodas, chocolate, cocoa, and certain pain medicines.  Follow your health care provider's instructions regarding eating and drinking before the test.  Take your medicines as directed at regular times with water unless instructed otherwise. Exceptions may include:  If you have diabetes, ask how you are to take your insulin or pills. It is common to adjust insulin dosing the  morning of the test.  If you are taking beta-blocker medicines, it is important to talk to your health care provider about these medicines well before the date of your test. Taking beta-blocker medicines may interfere with the test. In some cases, these medicines need to be changed or stopped 24 hours or more before the test.  If you wear a nitroglycerin patch, it may need to be removed prior to the test. Ask your health care provider if the patch should be removed before the test.  If you use an inhaler for any breathing condition, bring it with you to the test.  If you are an outpatient, bring a snack so you can eat right after the stress phase of the test.  Do not smoke for 4 hours prior to the test or as directed by your health care provider.  Wear loose-fitting clothes and comfortable shoes for the test. This test involves walking on a treadmill. PROCEDURE   Multiple electrodes will be put on your chest. If needed, small areas of your chest may be shaved to get better contact with the electrodes. Once the electrodes are attached to your body, multiple wires will be attached to the electrodes, and your heart rate will be monitored.  You will have an echocardiogram done at rest.  To produce this image of your heart, gel is applied to your chest, and a wand-like tool (transducer) is moved over the chest. The transducer sends the sound waves  through the chest to create the moving images of your heart.  You may need an IV to receive a medication that improves the quality of the pictures.  You will then walk on a treadmill. The treadmill will be started at a slow pace. The treadmill speed and incline will gradually be increased to raise your heart rate.  At the peak of exercise, the treadmill will be stopped. You will lie down immediately on a bed so that a second echocardiogram can be done to visualize your heart's motion with exercise.  The test usually takes 30-60 minutes to  complete. AFTER THE PROCEDURE  Your heart rate and blood pressure will be monitored after the test.  You may return to your normal schedule, including diet, activities, and medicines, unless your health care provider tells you otherwise.   This information is not intended to replace advice given to you by your health care provider. Make sure you discuss any questions you have with your health care provider.   Document Released: 07/21/2004 Document Revised: 07/22/2013 Document Reviewed: 03/24/2013 Elsevier Interactive Patient Education 2016 Elsevier Inc.  Holter Monitoring A Holter monitor is a small device that is used to detect abnormal heart rhythms. It clips to your clothing and is connected by wires to flat, sticky disks (electrodes) that attach to your chest. It is worn continuously for 24-48 hours. HOME CARE INSTRUCTIONS  Wear your Holter monitor at all times, even while exercising and sleeping, for as long as directed by your health care provider.  Make sure that the Holter monitor is safely clipped to your clothing or close to your body as recommended by your health care provider.  Do not get the monitor or wires wet.  Do not put body lotion or moisturizer on your chest.  Keep your skin clean.  Keep a diary of your daily activities, such as walking and doing chores. If you feel that your heartbeat is abnormal or that your heart is fluttering or skipping a beat:  Record what you are doing when it happens.  Record what time of day the symptoms occur.  Return your Holter monitor as directed by your health care provider.  Keep all follow-up visits as directed by your health care provider. This is important. SEEK IMMEDIATE MEDICAL CARE IF:  You feel lightheaded or you faint.  You have trouble breathing.  You feel pain in your chest, upper arm, or jaw.  You feel sick to your stomach and your skin is pale, cool, or damp.  You heartbeat feels unusual or abnormal.    This information is not intended to replace advice given to you by your health care provider. Make sure you discuss any questions you have with your health care provider.   Document Released: 04/14/2004 Document Revised: 08/07/2014 Document Reviewed: 02/23/2014 Elsevier Interactive Patient Education [employer] Elsevier Inc.

## 2016-06-12 DIAGNOSIS — R002 Palpitations: Secondary | ICD-10-CM | POA: Diagnosis not present

## 2016-06-12 DIAGNOSIS — R55 Syncope and collapse: Secondary | ICD-10-CM | POA: Diagnosis not present

## 2016-06-12 DIAGNOSIS — R0602 Shortness of breath: Secondary | ICD-10-CM

## 2016-06-21 ENCOUNTER — Encounter: Payer: 59 | Admitting: Physical Therapy

## 2016-06-28 ENCOUNTER — Other Ambulatory Visit: Payer: Self-pay | Admitting: Family Medicine

## 2016-07-05 ENCOUNTER — Encounter: Payer: 59 | Admitting: Physical Therapy

## 2016-07-05 ENCOUNTER — Other Ambulatory Visit: Payer: 59

## 2016-07-11 ENCOUNTER — Other Ambulatory Visit: Payer: 59

## 2016-07-12 ENCOUNTER — Encounter: Payer: Self-pay | Admitting: Physical Therapy

## 2016-07-12 NOTE — Therapy (Unsigned)
[facility] [address] [place], [place], [address] Phone: [phone]   Fax:  [phone]  Patient Details  Name: Ralph Oliver S Cherian MRN: [id number] Date of Birth: 09/29/1975 Referring Provider:  Therisa DoyneMc Gowan  Encounter Date: 07/12/2016   Discharge Summary   Pt was called by PT today to f/u on his POC after completing 3 treatments. Pt is self-discharging because he is unable to attend more appt due to work.  Pt reported his pelvic pain has improved by 50%. At his last visit, he stated his R rib pain decreased by 9/10 to 6/10.  He has achieved 3/4 goals. Pt demo'd improved thoracic mobility, decreased abdominal separation, and decreased pelvic floor mm tensions.       PT Long Term Goals - 05/03/16 1128      PT LONG TERM GOAL #1   Title Pt will be IND with testicular self exam in order to monitor sebaceous cyst and be proactive in preventive care   Time 12   Period Weeks   Status Achieved     PT LONG TERM GOAL #2   Title Pt will demo proper lifting mechanics with 50lb without cuing in order to progress to increased lifting to minimize strain on lumbopelvic system and perform job tasks   Time 12   Period Weeks   Status Achieved     PT LONG TERM GOAL #3   Title Pt will report decreased score on the NIH-CPSI score from   %   to < %  in order to demo improved pelvic floor function   Time 12   Period Weeks   Status Unable to assess     PT LONG TERM GOAL #4   Title Pt will report a decreased pain level from 4/10 to < 2/10 with erections  < 25% of the time in order to maintain QOL    Time 12   Period Weeks   Status Achieved        Mariane MastersYeung,Shin Yiing ,PT, DPT, E-RYT  07/12/2016, 3:20 PM  [facility] [address] , [place], [address] Phone: [phone]   Fax:  [phone]

## 2016-09-21 ENCOUNTER — Telehealth: Payer: Self-pay | Admitting: Family Medicine

## 2016-09-21 NOTE — Telephone Encounter (Signed)
Please try to get him scheduled with first available Friday if [facility] not [facility]

## 2016-09-21 NOTE — Telephone Encounter (Signed)
EASE NOTE: All timestamps contained within this report are represented as [place] Standard Time. CONFIDENTIALTY NOTICE: This fax transmission is intended only for the addressee. It contains information that is legally privileged, confidential or otherwise protected from use or disclosure. If you are not the intended recipient, you are strictly prohibited from reviewing, disclosing, copying using or disseminating any of this information or taking any action in reliance on or regarding this information. If you have received this fax in error, please notify us immediately by telephone so that we can arrange for its return to us. Phone: [phone], Toll-Free: [phone], Fax: [phone] Page: 1 of 1 Call Id: [id number] [facility] Client TELEPHONE ADVICE RECORD [facility] Patient Name: Ralph NewportRENT Simson DOB: 04/05/1976 Initial Comment coughed hard last night and felt pain on his left side. this morning he coughed hard and felt a pop on the left side by his rib cage. now hurts when he coughs or moves, no pain when he presses on it. Nurse Assessment Nurse: Elijah Birkaldwell, RN, Lynda Date/Time (Eastern Time): 09/21/2016 12:18:11 PM Confirm and document reason for call. If symptomatic, describe symptoms. ---Caller states he coughed hard last night and felt pain on his left side. This morning he coughed hard and felt a pop on the left side by his rib cage. Now it hurts, a sharp pain when he coughs or moves, no pain when he presses on it. Has had cough for a couple days, runny nose & congestion. Not sure if fever. Coughing up yellowish phlegm. Does the patient have any new or worsening symptoms? ---Yes Will a triage be completed? ---Yes Related visit to physician within the last 2 weeks? ---No Does the PT have any chronic conditions? (i.e. diabetes, asthma, etc.) ---Yes List chronic conditions. ---acid reflux Is this a behavioral health or substance abuse  call? ---No Guidelines Guideline Title Affirmed Question Affirmed Notes Chest Injury [1] Chest wall swelling, bruise or pain AND [2] present < 7 days Final Disposition User See PCP When Office is Open (within 3 days) Elijah Birkaldwell, RN, Stark BrayLynda Comments Following triage for chest injury (d/t hard coughing), caller mentioned that he was unable to go to work today, and it has been difficult to do normal activities d/t pain with movement. Upgraded outcome and attempted to find caller an appt. at office and another location. No availability. He may go to an UC for evaluation. Given general exit care advice for cough symptoms as well. Disagree/Comply: Comply

## 2016-09-22 NOTE — Telephone Encounter (Signed)
Pt did go to UC he was prescribed muscle a muscle relaxer and some nsaids pt will update us if he doesn't improve

## 2016-10-10 IMAGING — DX DG CHEST 2V
2 series · 2 of 2 positions shown · non-contrast
Comparison: 09/21/2011

CLINICAL DATA: Right-sided rib pain, no acute injury, initial
encounter

EXAM:
CHEST  2 VIEW

[chest pa]
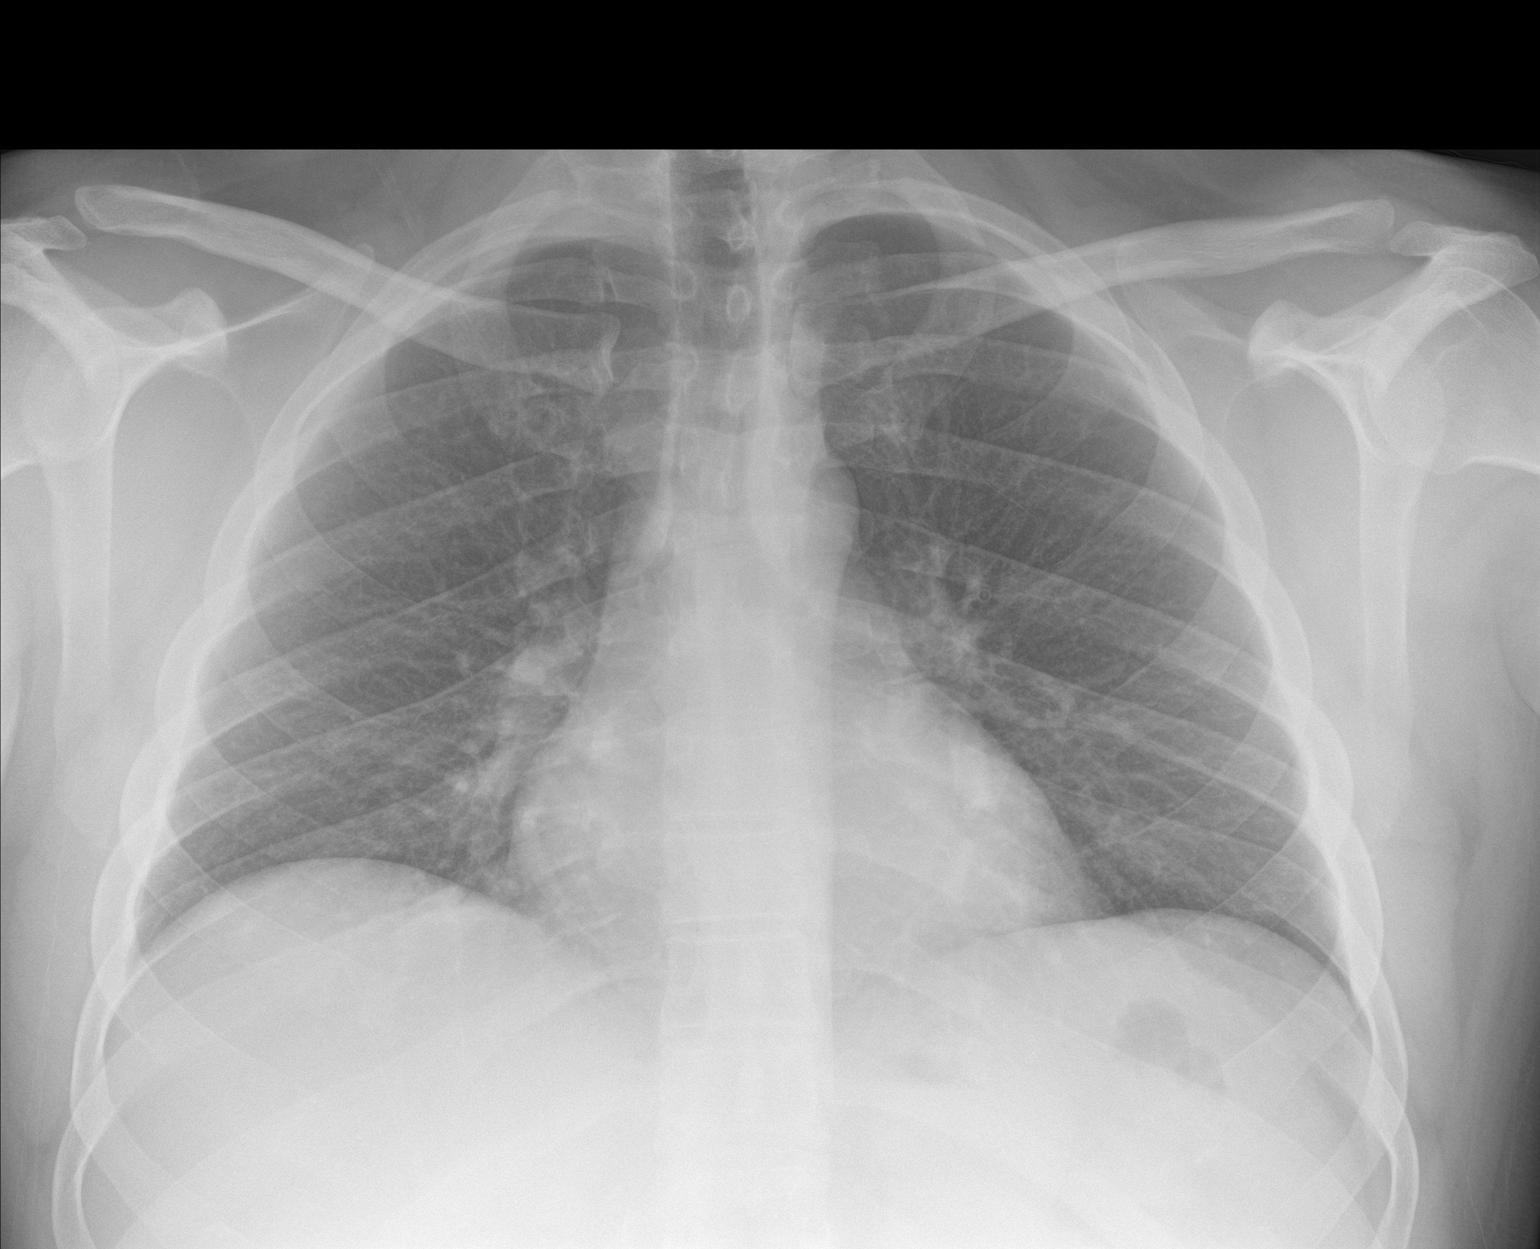

[chest lat]
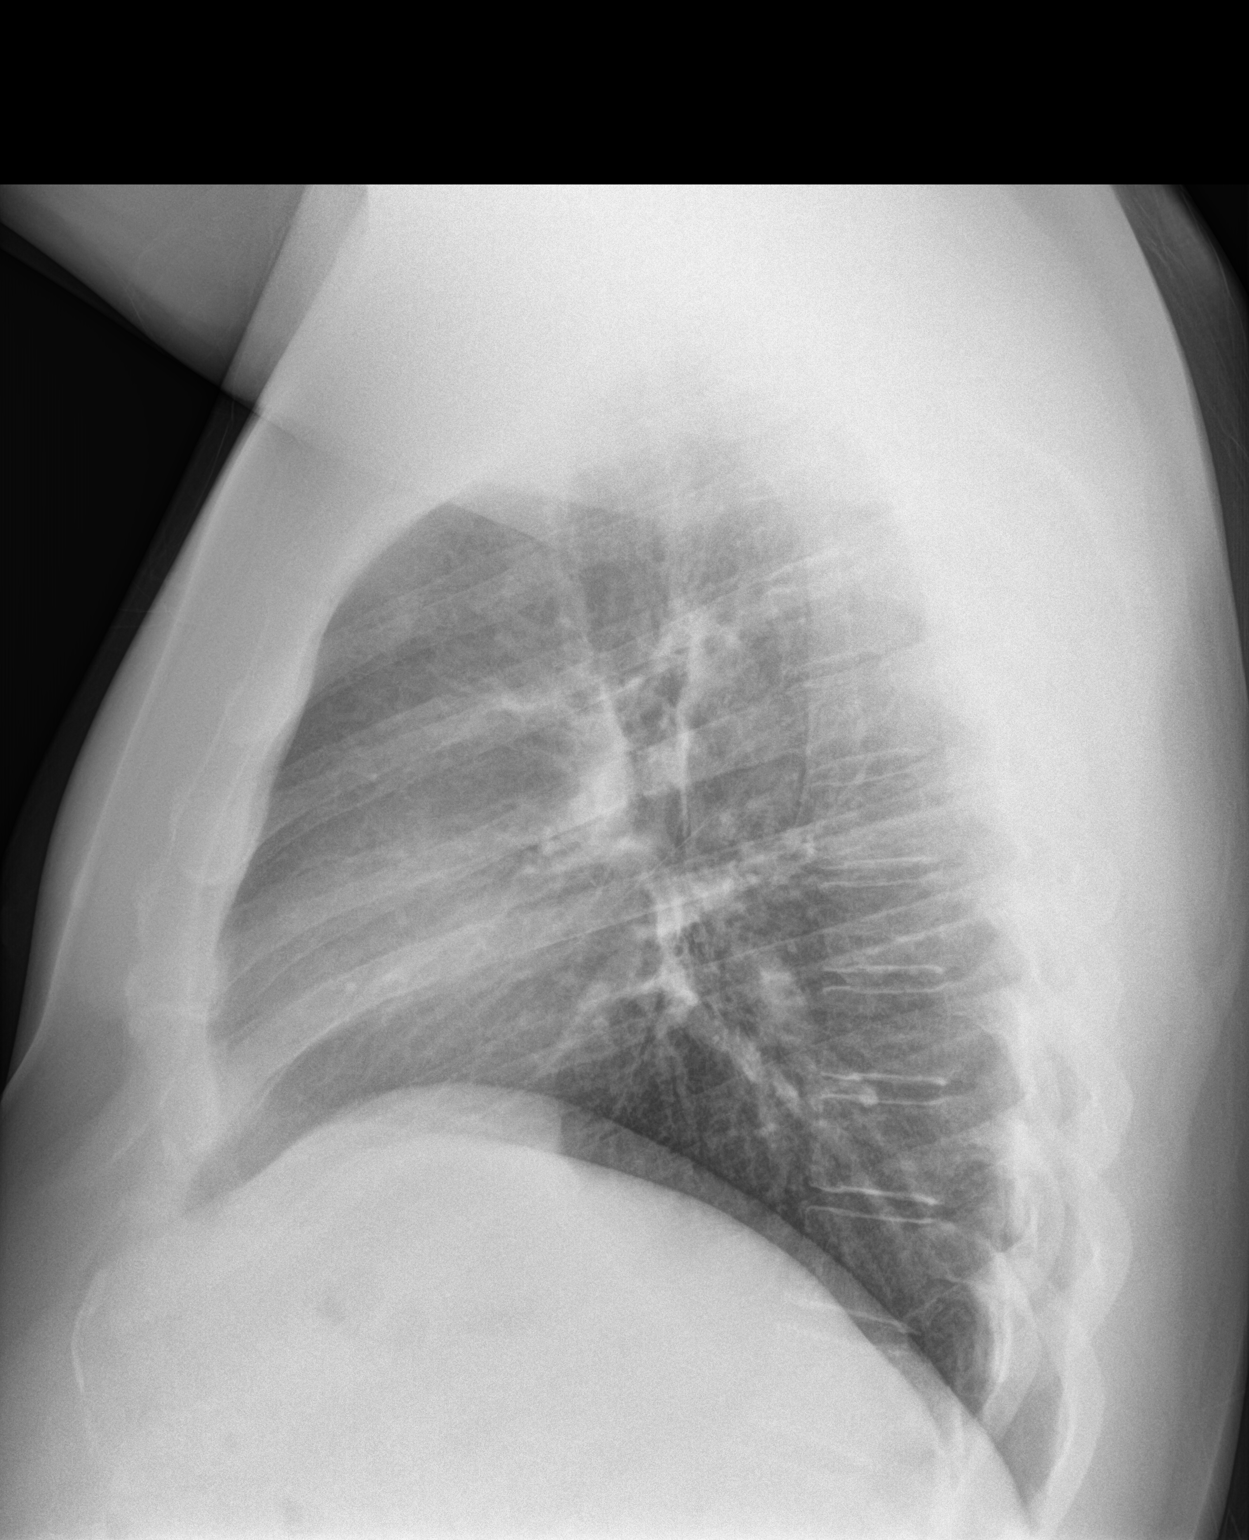

[2 of 2 positions shown; findings below may reference images not displayed]

FINDINGS: The heart size and mediastinal contours are within normal limits.
Both lungs are clear. The visualized skeletal structures are
unremarkable.
IMPRESSION: No active cardiopulmonary disease.

## 2016-11-18 ENCOUNTER — Other Ambulatory Visit: Payer: Self-pay | Admitting: Family Medicine

## 2017-01-05 ENCOUNTER — Ambulatory Visit (INDEPENDENT_AMBULATORY_CARE_PROVIDER_SITE_OTHER): Payer: 59 | Admitting: Family Medicine

## 2017-01-05 ENCOUNTER — Encounter: Payer: Self-pay | Admitting: Family Medicine

## 2017-01-05 VITALS — BP 136/90 | HR 103 | Temp 99.1°F | Ht 68.0 in | Wt 235.5 lb

## 2017-01-05 DIAGNOSIS — H6591 Unspecified nonsuppurative otitis media, right ear: Secondary | ICD-10-CM | POA: Insufficient documentation

## 2017-01-05 DIAGNOSIS — H6501 Acute serous otitis media, right ear: Secondary | ICD-10-CM

## 2017-01-05 MED ORDER — FLUTICASONE PROPIONATE 50 MCG/ACT NA SUSP: 2.0000 | Freq: Every day | NASAL | 1 refills | Status: DC

## 2017-01-05 NOTE — Progress Notes (Signed)
   BP 136/90   Pulse (!) 103   Temp 99.1 F (37.3 C)   Ht 5\' 8"  (1.727 m)   Wt 235 lb 8 oz (106.8 kg)   SpO2 96%   BMI 35.81 kg/m    CC: "Feels like I have something in my ear" Subjective:    Patient ID: Ralph Oliver, male    DOB: 03/08/1976, 41 y.o.   MRN: [id number]  HPI: Ralph Oliver is a 41 y.o. male presenting on 01/05/2017 for Acute Visit (R ear fluid )   1 wk h/o R ear popping without fullness or earache. No fevers/chills, no sinus congestion, no cough. Mild allergy symptoms. No hearing changes.  He does regularly smoke marijuana.  He did recently start ampicillin for body acne.  He does use Qtips.   Relevant past medical, surgical, family and social history reviewed and updated as indicated. Interim medical history since our last visit reviewed. Allergies and medications reviewed and updated. Outpatient Medications Prior to Visit  Medication Sig Dispense Refill  . omeprazole (PRILOSEC) 20 MG capsule TAKE ONE CAPSULE BY MOUTH DAILY 30 capsule 1   No facility-administered medications prior to visit.      Per HPI unless specifically indicated in ROS section below Review of Systems     Objective:    BP 136/90   Pulse (!) 103   Temp 99.1 F (37.3 C)   Ht 5\' 8"  (1.727 m)   Wt 235 lb 8 oz (106.8 kg)   SpO2 96%   BMI 35.81 kg/m   Wt Readings from Last 3 Encounters:  01/05/17 235 lb 8 oz (106.8 kg)  06/07/16 228 lb 6.4 oz (103.6 kg)  03/17/16 217 lb 4 oz (98.5 kg)    Physical Exam  Constitutional: He appears well-developed and well-nourished. No distress.  HENT:  Head: Normocephalic and atraumatic.  Right Ear: Hearing, external ear and ear canal normal.  Left Ear: Hearing, external ear and ear canal normal.  Nose: Nose normal. No mucosal edema or rhinorrhea.  Mouth/Throat: Oropharynx is clear and moist. No oropharyngeal exudate, posterior oropharyngeal edema, posterior oropharyngeal erythema or tonsillar abscesses.  Small amt fluid behind L TM Large amt  yellow fluid behind R TM  Eyes: Conjunctivae and EOM are normal. Pupils are equal, round, and reactive to light.  Neck: Normal range of motion. Neck supple.  Nursing note and vitals reviewed.     Assessment & Plan:   Problem List Items Addressed This Visit    Right serous otitis media - Primary    With ETD - anticipate ampicillin helping as well. Discussed treatment options - rec against decongestant due to borderline BP today. Rx flonase. Reviewed anticipated course of resolution, update if not improving with treatment. Pt agrees with plan..      Relevant Medications   ampicillin (PRINCIPEN) 500 MG capsule       Follow up plan: Return if symptoms worsen or fail to improve.  Eustaquio BoydenJavier Ajahni Nay, MD

## 2017-01-05 NOTE — Patient Instructions (Signed)
You do have eustachian tube dysfunction.  Treat with flonase nasal steroid.  Should get better over next few weeks.  Watch for fever >101, worsening pain.   Eustachian Tube Dysfunction The eustachian tube connects the middle ear to the back of the nose. It regulates air pressure in the middle ear by allowing air to move between the ear and nose. It also helps to drain fluid from the middle ear space. When the eustachian tube does not function properly, air pressure, fluid, or both can build up in the middle ear. Eustachian tube dysfunction can affect one or both ears. What are the causes? This condition happens when the eustachian tube becomes blocked or cannot open normally. This may result from:  Ear infections.  Colds and other upper respiratory infections.  Allergies.  Irritation, such as from cigarette smoke or acid from the stomach coming up into the esophagus (gastroesophageal reflux).  Sudden changes in air pressure, such as from descending in an airplane.  Abnormal growths in the nose or throat, such as nasal polyps, tumors, or enlarged tissue at the back of the throat (adenoids).  What increases the risk? This condition may be more likely to develop in people who smoke and people who are overweight. Eustachian tube dysfunction may also be more likely to develop in children, especially children who have:  Certain birth defects of the mouth, such as cleft palate.  Large tonsils and adenoids.  What are the signs or symptoms? Symptoms of this condition may include:  A feeling of fullness in the ear.  Ear pain.  Clicking or popping noises in the ear.  Ringing in the ear.  Hearing loss.  Loss of balance.  Symptoms may get worse when the air pressure around you changes, such as when you travel to an area of high elevation or fly on an airplane. How is this diagnosed? This condition may be diagnosed based on:  Your symptoms.  A physical exam of your ear, nose, and  throat.  Tests, such as those that measure: ? The movement of your eardrum (tympanogram). ? Your hearing (audiometry).  How is this treated? Treatment depends on the cause and severity of your condition. If your symptoms are mild, you may be able to relieve your symptoms by moving air into ("popping") your ears. If you have symptoms of fluid in your ears, treatment may include:  Decongestants.  Antihistamines.  Nasal sprays or ear drops that contain medicines that reduce swelling (steroids).  In some cases, you may need to have a procedure to drain the fluid in your eardrum (myringotomy). In this procedure, a small tube is placed in the eardrum to:  Drain the fluid.  Restore the air in the middle ear space.  Follow these instructions at home:  Take over-the-counter and prescription medicines only as told by your health care provider.  Use techniques to help pop your ears as recommended by your health care provider. These may include: ? Chewing gum. ? Yawning. ? Frequent, forceful swallowing. ? Closing your mouth, holding your nose closed, and gently blowing as if you are trying to blow air out of your nose.  Do not do any of the following until your health care provider approves: ? Travel to high altitudes. ? Fly in airplanes. ? Work in a Estate agentpressurized cabin or room. ? Scuba dive.  Keep your ears dry. Dry your ears completely after showering or bathing.  Do not smoke.  Keep all follow-up visits as told by your health care  provider. This is important. Contact a health care provider if:  Your symptoms do not go away after treatment.  Your symptoms come back after treatment.  You are unable to pop your ears.  You have: ? A fever. ? Pain in your ear. ? Pain in your head or neck. ? Fluid draining from your ear.  Your hearing suddenly changes.  You become very dizzy.  You lose your balance. This information is not intended to replace advice given to you by your  health care provider. Make sure you discuss any questions you have with your health care provider. Document Released: 08/13/2015 Document Revised: 12/23/2015 Document Reviewed: 08/05/2014 Elsevier Interactive Patient Education  [employer].

## 2017-01-05 NOTE — Assessment & Plan Note (Signed)
With ETD - anticipate ampicillin helping as well. Discussed treatment options - rec against decongestant due to borderline BP today. Rx flonase. Reviewed anticipated course of resolution, update if not improving with treatment. Pt agrees with plan..Marland Kitchen

## 2017-04-09 ENCOUNTER — Other Ambulatory Visit: Payer: Self-pay | Admitting: Family Medicine

## 2017-04-10 NOTE — Telephone Encounter (Signed)
Please schedule a f/u and refill until then 

## 2017-04-10 NOTE — Telephone Encounter (Signed)
Pt hasn't been seen in over a year and no future appts., please advise  

## 2017-04-11 NOTE — Telephone Encounter (Signed)
Left voicemail requesting pt to call the office and schedule a f/u appt before we can refill his med

## 2017-04-16 NOTE — Telephone Encounter (Signed)
Rx sent 

## 2017-04-16 NOTE — Telephone Encounter (Signed)
Patient called and scheduled follow up on 04/18/17.  He uses [facility]ilities [facility].  He would like the rx called in today.

## 2017-04-18 ENCOUNTER — Encounter: Payer: Self-pay | Admitting: Family Medicine

## 2017-04-18 ENCOUNTER — Ambulatory Visit (INDEPENDENT_AMBULATORY_CARE_PROVIDER_SITE_OTHER): Payer: 59 | Admitting: Family Medicine

## 2017-04-18 VITALS — BP 126/70 | HR 86 | Temp 98.0°F | Ht 68.0 in | Wt 236.5 lb

## 2017-04-18 DIAGNOSIS — K219 Gastro-esophageal reflux disease without esophagitis: Secondary | ICD-10-CM | POA: Diagnosis not present

## 2017-04-18 DIAGNOSIS — E785 Hyperlipidemia, unspecified: Secondary | ICD-10-CM

## 2017-04-18 LAB — COMPREHENSIVE METABOLIC PANEL
ALBUMIN: 4.5 g/dL (ref 3.5–5.2)
ALK PHOS: 80 U/L (ref 39–117)
ALT: 56 U/L — ABNORMAL HIGH (ref 0–53)
AST: 34 U/L (ref 0–37)
BUN: 13 mg/dL (ref 6–23)
CALCIUM: 9.7 mg/dL (ref 8.4–10.5)
CO2: 27 mEq/L (ref 19–32)
Chloride: 105 mEq/L (ref 96–112)
Creatinine, Ser: 0.85 mg/dL (ref 0.40–1.50)
GFR: 106.73 mL/min (ref >60.00)
Glucose, Bld: 125 mg/dL — ABNORMAL HIGH (ref 70–99)
POTASSIUM: 4 meq/L (ref 3.5–5.1)
Sodium: 139 mEq/L (ref 135–145)
TOTAL PROTEIN: 7.2 g/dL (ref 6.0–8.3)
Total Bilirubin: 0.4 mg/dL (ref 0.2–1.2)

## 2017-04-18 LAB — LDL CHOLESTEROL, DIRECT: LDL DIRECT: 124 mg/dL

## 2017-04-18 LAB — LIPID PANEL
Cholesterol: 200 mg/dL (ref 0–200)
HDL: 35 mg/dL — ABNORMAL LOW (ref >39.00)
NONHDL: 164.77
TRIGLYCERIDES: 360 mg/dL — AB (ref 0.0–149.0)
Total CHOL/HDL Ratio: 6
VLDL: 72 mg/dL — ABNORMAL HIGH (ref 0.0–40.0)

## 2017-04-18 MED ORDER — OMEPRAZOLE 20 MG PO CPDR: DELAYED_RELEASE_CAPSULE | ORAL | 11 refills | Status: DC

## 2017-04-18 NOTE — Assessment & Plan Note (Signed)
Due for lipid check Disc goals for lipids and reasons to control them Rev labs with pt (last check)  Has fam hx of CAD Rev low sat fat diet in detail  Handouts given

## 2017-04-18 NOTE — Patient Instructions (Addendum)
For cholesterol : Avoid red meat/ fried foods/ egg yolks/ fatty breakfast meats/ butter, cheese and high fat dairy/ and shellfish    Labs today  Continue omeprazole and avoid the foods that give you reflux symptoms

## 2017-04-18 NOTE — Assessment & Plan Note (Signed)
Doing well with omeprazole as well as long as he avoids spicy foods and tomato sauce  Would benefit from wt loss Has breakthrough symptoms if he takes ppi late Refilled omeprazole for a year

## 2017-04-18 NOTE — Progress Notes (Signed)
Subjective:    Patient ID: Ralph Oliver, male    DOB: 05/02/1976, 41 y.o.   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 at the house)  35.96 kg/m   GERD Takes omeprazole daily  About the same  As long as he takes it- no symptoms (if he forgets he breaks through with heartburn very quickly)  Tries to avoid red sauce / hot things   H/o hyperlipidemia Lab Results  Component Value Date   CHOL 218 (H) 02/08/2016   HDL 33.90 (L) 02/08/2016   LDLCALC 153 (H) 02/08/2016   LDLDIRECT 117.1 02/24/2010   TRIG 155.0 (H) 02/08/2016   CHOLHDL 6 02/08/2016   Not really watching diet  Father had CAD  Will re check  Loves milk  Some fried foods- not regular  Not a lot of beef   Patient Active Problem List   Diagnosis Date Noted  . Right serous otitis media 01/05/2017  . Shortness of breath 06/07/2016  . Right flank pain 02/14/2016  . Chest wall pain 02/08/2016  . Testicular pain 02/08/2016  . Chest pain 01/15/2015  . Hyperlipidemia, mild 01/15/2015  . Onychomycosis 03/03/2014  . Pain of left thumb 01/06/2014  . Tinea cruris 10/22/2013  . Nail fungus 02/20/2011  . TINEA CORPORIS 02/24/2010  . GERD 02/24/2010   Past Medical History:  Diagnosis Date  . Borderline hyperlipidemia   . GERD (gastroesophageal reflux disease)   . History of syncope    2013-- work-up done , no idiology.   . Osteomyelitis of finger (HCC)    left thumb  . Seasonal allergies   . Syncope and collapse    Past Surgical History:  Procedure Laterality Date  . INCISION AND DRAINAGE OF WOUND Left 09/02/2014   Procedure: LEFT THUMB DEBRIDEMENT  AND BONE CURETTAGE;  Surgeon:  Sharma Covert, MD;  Location: [facility] [place];  Service: Orthopedics;  Laterality: Left;  . TONSILLECTOMY  as child  . TRANSTHORACIC ECHOCARDIOGRAM  10-03-2011   normal LV, ef 50-55%, mild MR, mild LAE   Social History  Substance Use Topics  . Smoking status: Former Games developer  . Smokeless tobacco: Never Used     Comment: quit 20 years   . Alcohol use 6.0 oz/week    10 Cans of beer per week     Comment: occ   Family History  Problem Relation Age of Onset  . Anxiety disorder Mother   . Heart attack Father   . Cancer Father        cancer  . Pancreatic cancer Paternal Uncle   . Kidney cancer Neg Hx   . Prostate cancer Neg Hx    No Known Allergies Current Outpatient Prescriptions on File Prior to Visit  Medication Sig Dispense Refill  . fluticasone (FLONASE) 50 MCG/ACT nasal spray Place 2 sprays into both nostrils daily. (Patient taking differently: Place 2 sprays into both nostrils daily as needed. ) 16 g 1   No current facility-administered medications on file prior to visit.     Review of  Systems  Constitutional: Negative for activity change, appetite change, fatigue, fever and unexpected weight change.  HENT: Negative for congestion, rhinorrhea, sore throat and trouble swallowing.   Eyes: Negative for pain, redness, itching and visual disturbance.  Respiratory: Negative for cough, chest tightness, shortness of breath and wheezing.   Cardiovascular: Negative for chest pain and palpitations.  Gastrointestinal: Negative for abdominal pain, blood in stool, constipation, diarrhea and nausea.       Pos for heartburn symptoms if he takes his ppi too late or misses it   Endocrine: Negative for cold intolerance, heat intolerance, polydipsia and polyuria.  Genitourinary: Negative for difficulty urinating, dysuria, frequency and urgency.  Musculoskeletal: Negative for arthralgias, joint swelling and myalgias.  Skin: Negative for pallor and rash.  Neurological: Negative for  dizziness, tremors, weakness, numbness and headaches.  Hematological: Negative for adenopathy. Does not bruise/bleed easily.  Psychiatric/Behavioral: Negative for decreased concentration and dysphoric mood. The patient is not nervous/anxious.        Objective:   Physical Exam  Constitutional: He appears well-developed and well-nourished. No distress.  HENT:  Head: Normocephalic and atraumatic.  Mouth/Throat: Oropharynx is clear and moist.  Eyes: Pupils are equal, round, and reactive to light. Conjunctivae and EOM are normal.  Neck: Normal range of motion. Neck supple. No JVD present. Carotid bruit is not present. No thyromegaly present.  Cardiovascular: Normal rate, regular rhythm, normal heart sounds and intact distal pulses.  Exam reveals no gallop.   Pulmonary/Chest: Effort normal and breath sounds normal. No respiratory distress. He has no wheezes. He has no rales.  No crackles  Abdominal: Soft. Bowel sounds are normal. He exhibits no distension, no abdominal bruit and no mass. There is no tenderness. There is no rebound and no guarding.  Musculoskeletal: He exhibits no edema.  Lymphadenopathy:    He has no cervical adenopathy.  Neurological: He is alert. He has normal reflexes.  Skin: Skin is warm and dry. No rash noted.  Psychiatric: He has a normal mood and affect.          Assessment & Plan:   Problem List Items Addressed This Visit      Digestive   GERD - Primary    Doing well with omeprazole as well as long as he avoids spicy foods and tomato sauce  Would benefit from wt loss Has breakthrough symptoms if he takes ppi late Refilled omeprazole for a year       Relevant Medications   omeprazole (PRILOSEC) 20 MG capsule     Other   Hyperlipidemia, mild    Due for lipid check Disc goals for lipids and reasons to control them Rev labs with pt (last check)  Has fam hx of CAD Rev low sat fat diet in detail  Handouts given       Relevant Orders   Lipid panel  (Completed)   Comprehensive metabolic panel (Completed)

## 2017-05-02 ENCOUNTER — Ambulatory Visit: Payer: 59 | Admitting: Family Medicine

## 2017-05-07 ENCOUNTER — Encounter: Payer: Self-pay | Admitting: Family Medicine

## 2017-05-07 ENCOUNTER — Ambulatory Visit (INDEPENDENT_AMBULATORY_CARE_PROVIDER_SITE_OTHER): Payer: 59 | Admitting: Family Medicine

## 2017-05-07 VITALS — BP 142/84 | HR 83 | Temp 98.1°F | Ht 68.0 in | Wt 238.5 lb

## 2017-05-07 DIAGNOSIS — M79645 Pain in left finger(s): Secondary | ICD-10-CM | POA: Diagnosis not present

## 2017-05-07 DIAGNOSIS — E785 Hyperlipidemia, unspecified: Secondary | ICD-10-CM | POA: Diagnosis not present

## 2017-05-07 DIAGNOSIS — R7309 Other abnormal glucose: Secondary | ICD-10-CM | POA: Diagnosis not present

## 2017-05-07 DIAGNOSIS — R740 Nonspecific elevation of levels of transaminase and lactic acid dehydrogenase [LDH]: Secondary | ICD-10-CM | POA: Diagnosis not present

## 2017-05-07 DIAGNOSIS — R7401 Elevation of levels of liver transaminase levels: Secondary | ICD-10-CM | POA: Insufficient documentation

## 2017-05-07 DIAGNOSIS — R7303 Prediabetes: Secondary | ICD-10-CM | POA: Insufficient documentation

## 2017-05-07 LAB — HEMOGLOBIN A1C: Hgb A1c MFr Bld: 6 % (ref 4.6–6.5)

## 2017-05-07 NOTE — Assessment & Plan Note (Signed)
Disc goals for lipids and reasons to control them Rev labs with pt Rev low sat fat diet in detail HDL is low- rec exercise  Trig high - along with blood sugar -disc prev of DM with better diet

## 2017-05-07 NOTE — Patient Instructions (Addendum)
Get rid of sweetened drinks as much as you can  Drink water/ carbonated water with lemon or lime/ unsweet tea  A diet drink is ok occasionally   Then - try to cut sweets to occasional   Then   Try to get most of your carbohydrates from produce (with the exception of white potatoes)  Eat less bread/pasta/rice/snack foods/cereals/sweets and other items from the middle of the grocery store (processed carbs)   This will help you loose weight and decrease risk of diabetes  Exercise helps too   Let's check an A1C today   Your ALT may be due to fatty liver-we will watch this   Follow up in 3 months   We will work on a hand doctor referral for 2nd opinion

## 2017-05-07 NOTE — Assessment & Plan Note (Signed)
Ongoing with hx of ? Bone infection and bx in 2015 Now bothering him (no redness) and abn nail growth pattern req ref for 2nd op to a hand surgeon - ref done

## 2017-05-07 NOTE — Assessment & Plan Note (Signed)
Fasting glucose 125 Father had dm2  Obese with bmi of 36 disc imp of low glycemic diet and wt loss to prevent DM2  Disc elim sweets and sugar drinks -then working on better plan for carbs  Exercise plan made A1C today for baseline  Then re check 3 mo

## 2017-05-07 NOTE — Assessment & Plan Note (Signed)
Disc factors inv  No etoh or acetaminophen No hx of hepatitis Disc possible fatty liver condition  Will work on diet and exercise and re check in 3 mo with hep C screen

## 2017-05-07 NOTE — Progress Notes (Signed)
Subjective:    Patient ID: Ralph Oliver, male    DOB: 1976-02-28, 41 y.o.   MRN: [id number]  HPI Here for f/u of lab results   Also interested in a ref to hand surgeon - had infection L thumb/surgery  They were stumped  Bone bx neg  Still growing funny and hurts at time  Pain in thumb -but no drainage or pus  Nail has red spots - separates from nail bed more proximal than other nail and is shorter / ragged looking    Wt Readings from Last 3 Encounters:  05/07/17 238 lb 8 oz (108.2 kg)  04/18/17 236 lb 8 oz (107.3 kg)  01/05/17 235 lb 8 oz (106.8 kg)   36.26 kg/m  No regular exercise (works and works in yard)   Last labs: Office Visit on 04/18/2017  Component Date Value Ref Range Status  . Cholesterol 04/18/2017 200  0 - 200 mg/dL Final   ATP III Classification       Desirable:  < 200 mg/dL               Borderline High:  200 - 239 mg/dL          High:  > = 409 mg/dL  . Triglycerides 04/18/2017 360.0* 0.0 - 149.0 mg/dL Final   Normal:  <811 mg/dLBorderline High:  150 - 199 mg/dL  . HDL 04/18/2017 35.00* >39.00 mg/dL Final  . VLDL 91/47/8295 72.0* 0.0 - 40.0 mg/dL Final  . Total CHOL/HDL Ratio 04/18/2017 6   Final                  Men          Women1/2 Average Risk     3.4          3.3Average Risk          5.0          4.42X Average Risk          9.6          7.13X Average Risk          15.0          11.0                      . NonHDL 04/18/2017 164.77   Final   NOTE:  Non-HDL goal should be 30 mg/dL higher than patient's LDL goal (i.e. LDL goal of < 70 mg/dL, would have non-HDL goal of < 100 mg/dL)  . Sodium 04/18/2017 139  135 - 145 mEq/L Final  . Potassium 04/18/2017 4.0  3.5 - 5.1 mEq/L Final  . Chloride 04/18/2017 105  96 - 112 mEq/L Final  . CO2 04/18/2017 27  19 - 32 mEq/L Final  . Glucose, Bld 04/18/2017 125* 70 - 99 mg/dL Final  . BUN 62/13/0865 13  6 - 23 mg/dL Final  . Creatinine, Ser 04/18/2017 0.85  0.40 - 1.50 mg/dL Final  . Total Bilirubin 04/18/2017 0.4   0.2 - 1.2 mg/dL Final  . Alkaline Phosphatase 04/18/2017 80  39 - 117 U/L Final  . AST 04/18/2017 34  0 - 37 U/L Final  . ALT 04/18/2017 56* 0 - 53 U/L Final  . Total Protein 04/18/2017 7.2  6.0 - 8.3 g/dL Final  . Albumin 78/46/9629 4.5  3.5 - 5.2 g/dL Final  . Calcium 52/84/1324 9.7  8.4 - 10.5 mg/dL Final  . GFR 40/04/2724 106.73  >60.00 mL/min Final  .  Direct LDL 04/18/2017 124.0  mg/dL Final   Optimal:  <045 mg/dLNear or Above Optimal:  100-129 mg/dLBorderline High:  130-159 mg/dLHigh:  160-189 mg/dLVery High:  >190 mg/dL    Glucose 409 (fasting)  ALT 56  Lipids Lab Results  Component Value Date   CHOL 200 04/18/2017   HDL 35.00 (L) 04/18/2017   LDLCALC 153 (H) 02/08/2016   LDLDIRECT 124.0 04/18/2017   TRIG 360.0 (H) 04/18/2017   CHOLHDL 6 04/18/2017   Direct LDL 124 Trig up at 360   (fasting)  HDL a bit low  Had not had alcohol in 2 weeks  Elevated ALT at 56  Diet if not optimal  Am-does not eat breakfast Late lunch - perhaps fast food (burger w/o fries) , or sushi - small portion Dinner - bigger meal/ vegetable and meat and a side  At night -snacks (varies)  - almonds/trail mix/ chips   Quite a bit of fruit and veg  Drinks too many sodas and sweetened coffees   Sweets- fairly often  Bread/pasta -fairly often  Snack foods quite a bit  White potato - 2 times per week   No acetaminophen No alcohol (in 2 weeks)  Does not think he has been exposed to hepatitis  Not high risk   Some thirst (working out in the heat)  No frequent urination  No neuropathy symptoms   Review of Systems     Objective:   Physical Exam        Assessment & Plan:

## 2017-07-12 ENCOUNTER — Encounter: Payer: 59 | Admitting: Physician Assistant

## 2018-04-26 ENCOUNTER — Other Ambulatory Visit: Payer: Self-pay | Admitting: Family Medicine

## 2018-04-29 ENCOUNTER — Telehealth: Payer: Self-pay | Admitting: Family Medicine

## 2018-04-29 NOTE — Telephone Encounter (Signed)
Please schedule a fall f/u and refill until then  

## 2018-04-29 NOTE — Telephone Encounter (Signed)
Pt hasn't been seen in almost a year and no future appts., please advise  

## 2018-04-29 NOTE — Telephone Encounter (Signed)
Copied from CRM [id number]. Topic: Quick Communication - Rx Refill/Question >> Apr 29, 2018  1:47 PM Vivia Ewing A wrote: Medication:  omeprazole (PRILOSEC) 20 MG capsule   Has the patient contacted their pharmacy?YES (Agent: If no, request that the patient contact the pharmacy for the refill.) (Agent: If yes, when and what did the pharmacy advise?)  Preferred Pharmacy (with phone number or street name): [phone], [place] - [address] [phone] (Phone) [phone] (Fax)   Agent: Please be advised that RX refills may take up to 3 business days. We ask that you follow-up with your pharmacy.

## 2018-04-30 ENCOUNTER — Encounter: Payer: Self-pay | Admitting: Family Medicine

## 2018-04-30 ENCOUNTER — Ambulatory Visit: Payer: 59 | Admitting: Family Medicine

## 2018-04-30 VITALS — BP 128/72 | HR 76 | Temp 98.2°F | Ht 68.0 in | Wt 235.8 lb

## 2018-04-30 DIAGNOSIS — R740 Nonspecific elevation of levels of transaminase and lactic acid dehydrogenase [LDH]: Secondary | ICD-10-CM

## 2018-04-30 DIAGNOSIS — R7309 Other abnormal glucose: Secondary | ICD-10-CM | POA: Diagnosis not present

## 2018-04-30 DIAGNOSIS — R7401 Elevation of levels of liver transaminase levels: Secondary | ICD-10-CM

## 2018-04-30 DIAGNOSIS — E785 Hyperlipidemia, unspecified: Secondary | ICD-10-CM

## 2018-04-30 DIAGNOSIS — K219 Gastro-esophageal reflux disease without esophagitis: Secondary | ICD-10-CM | POA: Diagnosis not present

## 2018-04-30 MED ORDER — OMEPRAZOLE 20 MG PO CPDR: DELAYED_RELEASE_CAPSULE | ORAL | 11 refills | Status: DC

## 2018-04-30 MED ORDER — OMEPRAZOLE 20 MG PO CPDR: DELAYED_RELEASE_CAPSULE | ORAL | 0 refills | Status: DC

## 2018-04-30 MED ORDER — OMEPRAZOLE 20 MG PO CPDR: 20.0000 mg | DELAYED_RELEASE_CAPSULE | Freq: Every day | ORAL | 11 refills | Status: DC

## 2018-04-30 NOTE — Assessment & Plan Note (Signed)
Disc goals for lipids and reasons to control them Rev last labs with pt Rev low sat fat diet in detail Panel drawn today  Diet is not optimal-disc cutting back on fried foods/red meat

## 2018-04-30 NOTE — Patient Instructions (Addendum)
For cholesterol  Avoid red meat/ fried foods/ egg yolks/ fatty breakfast meats/ butter, cheese and high fat dairy/ and shellfish    Keep working on sugar in the diet  Try to get most of your carbohydrates from produce (with the exception of white potatoes)  Eat less bread/pasta/rice/snack foods/cereals/sweets and other items from the middle of the grocery store (processed carbs)   Avoid foods that flare your reflux  Work on healthy habits   Labs today

## 2018-04-30 NOTE — Progress Notes (Signed)
Subjective:    Patient ID: Ralph Oliver, male    DOB: [birth year], 42 y.o.   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 Very difficult to get time off   Declines flu shot   Wt Readings from Last 3 Encounters:  04/30/18 235 lb 12 oz (106.9 kg)  05/07/17 238 lb 8 oz (108.2 kg)  04/18/17 236 lb 8 oz (107.3 kg)  down 3 lb this year  Eating fair  Trying to do better -cut way back on sugar intake  Does drink one soda in the am  Physical job  Doing some push ups  35.85 kg/m    Takes omeprazole for GERD 20 mg daily  Symptoms are totally controlled with the medication  If he takes it late =he gets symptoms Tries to watch diet  He knows what triggers him- red sauce and peppers  Very rarely drinks alcohol    Elevated glucose in the past Lab Results  Component Value Date   HGBA1C 6.0 05/07/2017  one soda per day max Avoids sugar otherwise    Elevated ALT a year ago with no cause  56 Nl AST and bilirubin   Cholesterol Lab Results  Component Value Date   CHOL 200 04/18/2017   HDL 35.00 (L) 04/18/2017   LDLCALC 153 (H) 02/08/2016   LDLDIRECT 124.0 04/18/2017   TRIG 360.0 (H) 04/18/2017   CHOLHDL 6 04/18/2017  wants to re check  Has not made much change  Red meat - more than 2 times per month  Fried food - eats regularly  High cholesterol in the family    Patient Active Problem List   Diagnosis Date Noted  . Elevated glucose 05/07/2017  . Elevated ALT measurement 05/07/2017  . Hyperlipidemia, mild 01/15/2015  . GERD 02/24/2010   Past Medical History:  Diagnosis Date  . Borderline hyperlipidemia   . GERD (gastroesophageal reflux disease)   . History of syncope    2013-- work-up done , no idiology.   . Osteomyelitis of finger (HCC)    left thumb  . Seasonal allergies   . Syncope and collapse    Past Surgical History:  Procedure Laterality Date  . INCISION AND DRAINAGE OF  WOUND Left 09/02/2014   Procedure: LEFT THUMB DEBRIDEMENT  AND BONE CURETTAGE;  Surgeon: Sharma Covert, MD;  Location: [facility];  Service: Orthopedics;  Laterality: Left;  . TONSILLECTOMY  as child  . TRANSTHORACIC ECHOCARDIOGRAM  10-03-2011   normal LV, ef 50-55%, mild MR, mild LAE   Social History   Tobacco Use  . Smoking status: Former Games developer  . Smokeless tobacco: Never Used  . Tobacco comment: quit 20 years   Substance Use Topics  . Alcohol use: Yes    Alcohol/week: 10.0 standard drinks    Types: 10 Cans of beer per week    Comment: occ  . Drug use: Yes    Types: Marijuana    Comment: Marijuana - "average one joint daily"   Family History  Problem Relation Age of Onset  . Anxiety disorder Mother   . Heart attack Father   . Cancer Father        cancer  . Pancreatic cancer Paternal Uncle   . Kidney cancer Neg Hx   . Prostate cancer Neg Hx    No Known Allergies Current Outpatient Medications on File Prior to Visit  Medication Sig  Dispense Refill  . fluticasone (FLONASE) 50 MCG/ACT nasal spray Place 2 sprays into both nostrils daily. (Patient taking differently: Place 2 sprays into both nostrils daily as needed. ) 16 g 1   No current facility-administered medications on file prior to visit.     Review of Systems  Constitutional: Negative for activity change, appetite change, fatigue, fever and unexpected weight change.  HENT: Negative for congestion, rhinorrhea, sore throat and trouble swallowing.   Eyes: Negative for pain, redness, itching and visual disturbance.  Respiratory: Negative for cough, chest tightness, shortness of breath and wheezing.   Cardiovascular: Negative for chest pain and palpitations.  Gastrointestinal: Negative for abdominal pain, blood in stool, constipation, diarrhea and nausea.       Heartburn and abd discomfort if he misses omeprazole dose   Endocrine: Negative for cold intolerance, heat intolerance, polydipsia and polyuria.    Genitourinary: Negative for difficulty urinating, dysuria, frequency and urgency.  Musculoskeletal: Negative for arthralgias, joint swelling and myalgias.  Skin: Negative for pallor and rash.  Neurological: Negative for dizziness, tremors, weakness, numbness and headaches.  Hematological: Negative for adenopathy. Does not bruise/bleed easily.  Psychiatric/Behavioral: Negative for decreased concentration and dysphoric mood. The patient is not nervous/anxious.        Objective:   Physical Exam  Constitutional: He appears well-developed and well-nourished. No distress.  obese and well appearing   HENT:  Head: Normocephalic and atraumatic.  Mouth/Throat: Oropharynx is clear and moist.  Eyes: Pupils are equal, round, and reactive to light. Conjunctivae and EOM are normal.  Neck: Normal range of motion. Neck supple. No JVD present. Carotid bruit is not present. No thyromegaly present.  Cardiovascular: Normal rate, regular rhythm, normal heart sounds and intact distal pulses. Exam reveals no gallop.  Pulmonary/Chest: Effort normal and breath sounds normal. No respiratory distress. He has no wheezes. He has no rales.  No crackles  Abdominal: Soft. Bowel sounds are normal. He exhibits no distension, no abdominal bruit and no mass. There is no tenderness.  Musculoskeletal: He exhibits no edema or tenderness.  Lymphadenopathy:    He has no cervical adenopathy.  Neurological: He is alert. He has normal reflexes. He displays normal reflexes. No cranial nerve deficit. Coordination normal.  Skin: Skin is warm and dry. No rash noted.  Psychiatric: He has a normal mood and affect.  Cheerful and talkative           Assessment & Plan:   Problem List Items Addressed This Visit      Digestive   GERD - Primary    Doing well with PPI-unable to get off of it without significant rebound symptoms  Disc wt loss and diet to help GERD  He avoids trigger foods Refill omeprazole  Enc good habits         Relevant Medications   omeprazole (PRILOSEC) 20 MG capsule     Other   Elevated ALT measurement    Re check AST/ALT today  No symptoms No etoh (very rare)        Relevant Orders   Comprehensive metabolic panel   Elevated glucose    In the setting of family hx of DM and obesity  A1C today  Has made a good effort at low glycemic diet -enc to continue       Relevant Orders   Hemoglobin A1c   Hyperlipidemia, mild    Disc goals for lipids and reasons to control them Rev last labs with pt Rev low sat fat diet in detail  Panel drawn today  Diet is not optimal-disc cutting back on fried foods/red meat       Relevant Orders   Lipid panel

## 2018-04-30 NOTE — Telephone Encounter (Signed)
Pt coming in today at 3pm

## 2018-04-30 NOTE — Assessment & Plan Note (Signed)
Re check AST/ALT today  No symptoms No etoh (very rare)

## 2018-04-30 NOTE — Assessment & Plan Note (Signed)
Doing well with PPI-unable to get off of it without significant rebound symptoms  Disc wt loss and diet to help GERD  He avoids trigger foods Refill omeprazole  Enc good habits

## 2018-04-30 NOTE — Telephone Encounter (Signed)
Carrie will reach out to pt to try and get appt scheduled. Med filled once  

## 2018-04-30 NOTE — Assessment & Plan Note (Signed)
In the setting of family hx of DM and obesity  A1C today  Has made a good effort at low glycemic diet -enc to continue

## 2018-05-01 LAB — COMPREHENSIVE METABOLIC PANEL
ALBUMIN: 4.6 g/dL (ref 3.5–5.2)
ALT: 30 U/L (ref 0–53)
AST: 21 U/L (ref 0–37)
Alkaline Phosphatase: 76 U/L (ref 39–117)
BUN: 17 mg/dL (ref 6–23)
CHLORIDE: 106 meq/L (ref 96–112)
CO2: 26 mEq/L (ref 19–32)
Calcium: 9.9 mg/dL (ref 8.4–10.5)
Creatinine, Ser: 0.84 mg/dL (ref 0.40–1.50)
GFR: 107.62 mL/min (ref >60.00)
Glucose, Bld: 95 mg/dL (ref 70–99)
Potassium: 4 mEq/L (ref 3.5–5.1)
SODIUM: 140 meq/L (ref 135–145)
Total Bilirubin: 0.4 mg/dL (ref 0.2–1.2)
Total Protein: 7.4 g/dL (ref 6.0–8.3)

## 2018-05-01 LAB — LIPID PANEL
CHOL/HDL RATIO: 6
CHOLESTEROL: 183 mg/dL (ref 0–200)
HDL: 30 mg/dL — ABNORMAL LOW (ref >39.00)
NonHDL: 153.27
Triglycerides: 273 mg/dL — ABNORMAL HIGH (ref 0.0–149.0)
VLDL: 54.6 mg/dL — ABNORMAL HIGH (ref 0.0–40.0)

## 2018-05-01 LAB — LDL CHOLESTEROL, DIRECT: LDL DIRECT: 124 mg/dL

## 2018-05-01 LAB — HEMOGLOBIN A1C: Hgb A1c MFr Bld: 5.8 % (ref 4.6–6.5)

## 2018-05-20 ENCOUNTER — Telehealth: Payer: Self-pay | Admitting: Family Medicine

## 2018-05-20 NOTE — Telephone Encounter (Signed)
[facility] and they are faxing over the forms to complete the PA, they advise me a PA is required any time we prescribed more then 3 months worth of GERD medication for the full year, I will await the fax

## 2018-05-20 NOTE — Telephone Encounter (Signed)
Copied from CRM [id number]. Topic: Quick Communication - See Telephone Encounter >> May 20, 2018 11:47 AM Herby Abraham C wrote: CRM for notification. See Telephone encounter for: 05/20/18.  Pt says that he has a new insurance and was advised by them to have the office to call to authorize refill on medication Omeprazole 20 MG. Showing Rx for medication for 04/30/18, pt says that pharmacy has been stating that they are not able to fill so pt still haven't received medication.   Insurance # [phone]  Please assist.

## 2018-05-20 NOTE — Telephone Encounter (Signed)
Dr. Milinda Antis completed form and I faxed it in, I will await a response

## 2018-05-20 NOTE — Telephone Encounter (Signed)
Spoke with pharmacy and they said a PA is needed for med, and the pharmacy will re-fax the form with the info we need to do the PA since we never received the 1st one

## 2018-05-20 NOTE — Telephone Encounter (Signed)
Form received and placed in Dr. Royden Purl inbox to review and answer yes or no to questions 1-5 and sing and date form

## 2018-05-21 NOTE — Telephone Encounter (Signed)
PA was approved, pt had pharmacy notified, letter placed in Dr. Royden Purl inbox to sign and send for scanning

## 2018-05-31 ENCOUNTER — Telehealth: Payer: Self-pay | Admitting: Family Medicine

## 2018-05-31 NOTE — Telephone Encounter (Signed)
Pt calling for lab results he had done few weeks ago. Please call pt.

## 2018-05-31 NOTE — Telephone Encounter (Signed)
Left VM letting pt know lab results are on mychart and if for some reason he can't view them to call us back

## 2019-04-14 ENCOUNTER — Other Ambulatory Visit: Payer: Self-pay

## 2019-04-14 DIAGNOSIS — Z20822 Contact with and (suspected) exposure to covid-19: Secondary | ICD-10-CM

## 2019-04-15 LAB — NOVEL CORONAVIRUS, NAA: SARS-CoV-2, NAA: NOT DETECTED

## 2019-05-27 ENCOUNTER — Other Ambulatory Visit: Payer: Self-pay | Admitting: *Deleted

## 2019-05-27 NOTE — Telephone Encounter (Signed)
Please schedule a winter or sping f/u or PE (his choice) and refill until then

## 2019-05-27 NOTE — Telephone Encounter (Signed)
Pt hasn't been seen in over a year and no future appts., please advise  

## 2019-05-28 NOTE — Telephone Encounter (Signed)
I left a detailed message on patient's voice mail to call back and schedule appointment. °

## 2019-05-28 NOTE — Telephone Encounter (Signed)
Patient called back and schedule follow up visit for Dec 18th

## 2019-05-29 MED ORDER — OMEPRAZOLE 20 MG PO CPDR: 20.0000 mg | DELAYED_RELEASE_CAPSULE | Freq: Every day | ORAL | 0 refills | Status: DC

## 2019-05-29 NOTE — Telephone Encounter (Signed)
Med refilled.

## 2019-06-25 ENCOUNTER — Telehealth: Payer: Self-pay | Admitting: Family Medicine

## 2019-07-01 NOTE — Telephone Encounter (Signed)
[facility] put [facility] note that says insurance doesn't cover med and request an alt med to be sent in, please advise

## 2019-07-01 NOTE — Telephone Encounter (Signed)
I replaced it with omeprazole

## 2019-07-03 NOTE — Telephone Encounter (Signed)
I"m confused- that is the same medicine?

## 2019-07-03 NOTE — Telephone Encounter (Signed)
Fax received states that the omeprazole 20 mg caps are not covered. The alt med they request is : Omeprazole 20 mg delayed release or capsule. Please advise  [facility]

## 2019-07-04 MED ORDER — ESOMEPRAZOLE MAGNESIUM 20 MG PO CPDR: 20.0000 mg | DELAYED_RELEASE_CAPSULE | Freq: Every day | ORAL | 3 refills | Status: DC

## 2019-07-04 NOTE — Telephone Encounter (Signed)
[facility] and [facility] did [facility] wrong drug on fax. Pt's insurance doesn't cover the omeprazole but the alt meds they do cover are:  Protonix 40mg , Lansoprazole 30mg , and esomeprazole 20 mgs (once daily dosing)

## 2019-07-04 NOTE — Addendum Note (Signed)
Addended by: Loura Pardon A on: 07/04/2019 04:45 PM   Modules accepted: Orders

## 2019-07-04 NOTE — Telephone Encounter (Signed)
I sent the esomeprazole It should work well  If any problems let me know

## 2019-07-18 ENCOUNTER — Ambulatory Visit: Payer: 59 | Admitting: Family Medicine

## 2019-08-25 ENCOUNTER — Ambulatory Visit (INDEPENDENT_AMBULATORY_CARE_PROVIDER_SITE_OTHER): Payer: Self-pay | Admitting: Family Medicine

## 2019-08-25 ENCOUNTER — Encounter: Payer: Self-pay | Admitting: Family Medicine

## 2019-08-25 ENCOUNTER — Ambulatory Visit (INDEPENDENT_AMBULATORY_CARE_PROVIDER_SITE_OTHER): Payer: 59 | Admitting: Family Medicine

## 2019-08-25 ENCOUNTER — Other Ambulatory Visit: Payer: Self-pay

## 2019-08-25 VITALS — BP 126/80 | HR 96 | Temp 98.7°F | Ht 70.0 in | Wt 235.6 lb

## 2019-08-25 VITALS — BP 136/80 | HR 88 | Temp 98.2°F | Ht 70.5 in | Wt 232.4 lb

## 2019-08-25 DIAGNOSIS — R7309 Other abnormal glucose: Secondary | ICD-10-CM

## 2019-08-25 DIAGNOSIS — E785 Hyperlipidemia, unspecified: Secondary | ICD-10-CM

## 2019-08-25 DIAGNOSIS — Z0000 Encounter for general adult medical examination without abnormal findings: Secondary | ICD-10-CM | POA: Diagnosis not present

## 2019-08-25 DIAGNOSIS — K219 Gastro-esophageal reflux disease without esophagitis: Secondary | ICD-10-CM | POA: Diagnosis not present

## 2019-08-25 DIAGNOSIS — Z024 Encounter for examination for driving license: Secondary | ICD-10-CM

## 2019-08-25 LAB — CBC WITH DIFFERENTIAL/PLATELET
Basophils Absolute: 0 10*3/uL (ref 0.0–0.1)
Basophils Relative: 0.5 % (ref 0.0–3.0)
Eosinophils Absolute: 0.2 10*3/uL (ref 0.0–0.7)
Eosinophils Relative: 2.5 % (ref 0.0–5.0)
HCT: 44.9 % (ref 39.0–52.0)
Hemoglobin: 15.3 g/dL (ref 13.0–17.0)
Lymphocytes Relative: 46.7 % — ABNORMAL HIGH (ref 12.0–46.0)
Lymphs Abs: 2.8 10*3/uL (ref 0.7–4.0)
MCHC: 34 g/dL (ref 30.0–36.0)
MCV: 84.4 fl (ref 78.0–100.0)
Monocytes Absolute: 0.4 10*3/uL (ref 0.1–1.0)
Monocytes Relative: 6.9 % (ref 3.0–12.0)
Neutro Abs: 2.6 10*3/uL (ref 1.4–7.7)
Neutrophils Relative %: 43.4 % (ref 43.0–77.0)
Platelets: 218 10*3/uL (ref 150.0–400.0)
RBC: 5.31 Mil/uL (ref 4.22–5.81)
RDW: 14.3 % (ref 11.5–15.5)
WBC: 5.9 10*3/uL (ref 4.0–10.5)

## 2019-08-25 LAB — LIPID PANEL
Cholesterol: 200 mg/dL (ref 0–200)
HDL: 33.3 mg/dL — ABNORMAL LOW (ref >39.00)
LDL Cholesterol: 137 mg/dL — ABNORMAL HIGH (ref 0–99)
NonHDL: 166.62
Total CHOL/HDL Ratio: 6
Triglycerides: 149 mg/dL (ref 0.0–149.0)
VLDL: 29.8 mg/dL (ref 0.0–40.0)

## 2019-08-25 LAB — COMPREHENSIVE METABOLIC PANEL
ALT: 24 U/L (ref 0–53)
AST: 17 U/L (ref 0–37)
Albumin: 4.6 g/dL (ref 3.5–5.2)
Alkaline Phosphatase: 77 U/L (ref 39–117)
BUN: 13 mg/dL (ref 6–23)
CO2: 29 mEq/L (ref 19–32)
Calcium: 9.4 mg/dL (ref 8.4–10.5)
Chloride: 104 mEq/L (ref 96–112)
Creatinine, Ser: 0.83 mg/dL (ref 0.40–1.50)
GFR: 101.99 mL/min (ref >60.00)
Glucose, Bld: 113 mg/dL — ABNORMAL HIGH (ref 70–99)
Potassium: 4.1 mEq/L (ref 3.5–5.1)
Sodium: 139 mEq/L (ref 135–145)
Total Bilirubin: 0.4 mg/dL (ref 0.2–1.2)
Total Protein: 6.9 g/dL (ref 6.0–8.3)

## 2019-08-25 LAB — TSH: TSH: 1.61 u[IU]/mL (ref 0.35–4.50)

## 2019-08-25 LAB — HEMOGLOBIN A1C: Hgb A1c MFr Bld: 5.9 % (ref 4.6–6.5)

## 2019-08-25 NOTE — Assessment & Plan Note (Signed)
Taking omeprazole 20 mg daily otc As long as he watches what he eats - is fairly well controlled

## 2019-08-25 NOTE — Progress Notes (Signed)
Subjective:  Patient ID: Ralph Oliver, male    DOB: 10-02-1975  Age: 44 y.o. MRN: [id number]  CC:  Chief Complaint  Patient presents with  . DOT    pt states no issues with geberal health. pt feels well.    HPI Ralph Oliver presents for  DOT physical.  Omeprazole for heartburn.  No history of OSA/CPAP or daytime somnolence.  No chest pain or shortness of breath with activity.  Has routine follow-up with primary care provider.  History Patient Active Problem List   Diagnosis Date Noted  . Routine general medical examination at a health care facility 08/25/2019  . Elevated glucose 05/07/2017  . Elevated ALT measurement 05/07/2017  . Hyperlipidemia, mild 01/15/2015  . GERD 02/24/2010   Past Medical History:  Diagnosis Date  . Borderline hyperlipidemia   . GERD (gastroesophageal reflux disease)   . History of syncope    2013-- work-up done , no idiology.   . Osteomyelitis of finger (HCC)    left thumb  . Seasonal allergies   . Syncope and collapse    Past Surgical History:  Procedure Laterality Date  . INCISION AND DRAINAGE OF WOUND Left 09/02/2014   Procedure: LEFT THUMB DEBRIDEMENT  AND BONE CURETTAGE;  Surgeon: Linna Hoff, MD;  Location: [facility];  Service: Orthopedics;  Laterality: Left;  . TONSILLECTOMY  as child  . TRANSTHORACIC ECHOCARDIOGRAM  10-03-2011   normal LV, ef 50-55%, mild MR, mild LAE   No Known Allergies Prior to Admission medications   Medication Sig Start Date End Date Taking? Authorizing Provider  omeprazole (PRILOSEC OTC) 20 MG tablet Take 20 mg by mouth daily.   Yes [provider]  fluticasone (FLONASE) 50 MCG/ACT nasal spray Place 2 sprays into both nostrils daily. Patient not taking: Reported on 08/25/2019 01/05/17   Ria Bush, MD   Social History   Socioeconomic History  . Marital status: Single    Spouse name: Not on file  . Number of children: Not on file  . Years of education: Not on file  .  Highest education level: Not on file  Occupational History  . Occupation: Proofreader  Tobacco Use  . Smoking status: Former Research scientist (life sciences)  . Smokeless tobacco: Never Used  . Tobacco comment: quit 20 years   Substance and Sexual Activity  . Alcohol use: Yes    Alcohol/week: 10.0 standard drinks    Types: 10 Cans of beer per week    Comment: occ  . Drug use: Yes    Types: Marijuana    Comment: Marijuana - "average one joint daily"  . Sexual activity: Not on file  Other Topics Concern  . Not on file  Social History Narrative   Regular exercise:yes-- very physical job   Social Determinants of Health   Financial Resource Strain:   . Difficulty of Paying Living Expenses: Not on file  Food Insecurity:   . Worried About Charity fundraiser in the Last Year: Not on file  . Ran Out of Food in the Last Year: Not on file  Transportation Needs:   . Lack of Transportation (Medical): Not on file  . Lack of Transportation (Non-Medical): Not on file  Physical Activity:   . Days of Exercise per Week: Not on file  . Minutes of Exercise per Session: Not on file  Stress:   . Feeling of Stress : Not on file  Social Connections:   . Frequency of Communication with Friends and Family:  Not on file  . Frequency of Social Gatherings with Friends and Family: Not on file  . Attends Religious Services: Not on file  . Active Member of Clubs or Organizations: Not on file  . Attends Banker Meetings: Not on file  . Marital Status: Not on file  Intimate Partner Violence:   . Fear of Current or Ex-Partner: Not on file  . Emotionally Abused: Not on file  . Physically Abused: Not on file  . Sexually Abused: Not on file    Review of Systems Per DOT form.   Objective:   Vitals:   08/25/19 1356 08/25/19 1403  BP: 134/87 126/80  Pulse: 96   Temp: 98.7 F (37.1 C)   TempSrc: Temporal   SpO2: 96%   Weight: 235 lb 9.6 oz (106.9 kg)   Height: 5\' 10"  (1.778 m)      Physical Exam Vitals  reviewed.  Constitutional:      Appearance: He is well-developed.  HENT:     Head: Normocephalic and atraumatic.     Right Ear: External ear normal.     Left Ear: External ear normal.  Eyes:     Conjunctiva/sclera: Conjunctivae normal.     Pupils: Pupils are equal, round, and reactive to light.  Neck:     Thyroid: No thyromegaly.     Vascular: No carotid bruit or JVD.  Cardiovascular:     Rate and Rhythm: Normal rate and regular rhythm.     Heart sounds: Normal heart sounds. No murmur.  Pulmonary:     Effort: Pulmonary effort is normal. No respiratory distress.     Breath sounds: Normal breath sounds. No wheezing or rales.  Abdominal:     General: There is no distension.     Palpations: Abdomen is soft.     Tenderness: There is no abdominal tenderness.     Hernia: There is no hernia in the left inguinal area or right inguinal area.  Musculoskeletal:        General: No tenderness. Normal range of motion.     Cervical back: Normal range of motion and neck supple.  Lymphadenopathy:     Cervical: No cervical adenopathy.  Skin:    General: Skin is warm and dry.  Neurological:     Mental Status: He is alert and oriented to person, place, and time.     Deep Tendon Reflexes: Reflexes are normal and symmetric.  Psychiatric:        Behavior: Behavior normal.        Assessment & Plan:  Ralph Oliver is a 44 y.o. male . Encounter for commercial driver medical examination (CDME)  -2-year card provided, omeprazole only as needed for heartburn.   No concerns on history or exam.  Paperwork provided, see copies.  Ongoing follow-up with primary care provider discussed.  No orders of the defined types were placed in this encounter.  Patient Instructions       If you have lab work done today you will be contacted with your lab results within the next 2 weeks.  If you have not heard from 46 then please contact us. The fastest way to get your results is to register for My  Chart.   IF you received an x-ray today, you will receive an invoice from [facility]. Please contact [facility] at [phone] with questions or concerns regarding your invoice.   IF you received labwork today, you will receive an invoice from [phone]. Please contact [facility] at  [phone] with questions or concerns regarding your invoice.   Our billing staff will not be able to assist you with questions regarding bills from these companies.  You will be contacted with the lab results as soon as they are available. The fastest way to get your results is to activate your My Chart account. Instructions are located on the last page of this paperwork. If you have not heard from [place] regarding the results in 2 weeks, please contact this office.         Signed, Merri Ray, MD [facility]

## 2019-08-25 NOTE — Assessment & Plan Note (Signed)
Labs today Disc goals for lipids and reasons to control them Rev last labs with pt Rev low sat fat diet in detail Diet reviewed - recommended less trans fats and shellfish

## 2019-08-25 NOTE — Assessment & Plan Note (Signed)
A1C with labs today  Wt is stable  disc imp of low glycemic diet and wt loss to prevent DM2

## 2019-08-25 NOTE — Progress Notes (Signed)
Subjective:    Patient ID: Ralph Oliver, male    DOB: [birth year], 44 y.o.   MRN: [id number]  This visit occurred during the SARS-CoV-2 public health emergency.  Safety protocols were in place, including screening questions prior to the visit, additional usage of staff PPE, and extensive cleaning of exam room while observing appropriate contact time as indicated for disinfecting solutions.    HPI Here for health maintenance exam and to review chronic medical problems    Work is picking back up with pandemic   Feeling pretty good overall   He had a viral syndrome in the fall - covid test was negative  Taking care of himself   Exercise- work / very strenuous  Also plays with daughter- walks with her on her bike    Flu vaccine - declines   Td 7/11  Weight  32.88 kg/m Wt Readings from Last 3 Encounters:  08/25/19 232 lb 7 oz (105.4 kg)  04/30/18 235 lb 12 oz (106.9 kg)  05/07/17 238 lb 8 oz (108.2 kg)  wt is down a few lbs   Diet is fair- was doing really well for a while and then fell off the wagon a bit  Was home for a while with daughter and she is back in school now   BP Readings from Last 3 Encounters:  08/25/19 (!) 148/96  04/30/18 128/72  05/07/17 (!) 142/84  first check high  2nd check BP: 136/80  Will continue to watch this  No headaches    Pulse Readings from Last 3 Encounters:  08/25/19 88  04/30/18 76  05/07/17 83     H/o mildly elevated lipids Lab Results  Component Value Date   CHOL 183 04/30/2018   HDL 30.00 (L) 04/30/2018   LDLCALC 153 (H) 02/08/2016   LDLDIRECT 124.0 04/30/2018   TRIG 273.0 (H) 04/30/2018   CHOLHDL 6 04/30/2018  was eating healthier  Now off track  Tries to eat more chicken  Too much shellfish  Less fried foods    Also elevated glucose Lab Results  Component Value Date   HGBA1C 5.8 04/30/2018  too much sugar in diet lately  Did cut back on soda some - could do better   And elevated ALT in the past  Lab  Results  Component Value Date   ALT 30 04/30/2018   AST 21 04/30/2018   ALKPHOS 76 04/30/2018   BILITOT 0.4 04/30/2018   This was resolved at last check    H/o GERD Insurance co stopped paying for omeprazole -so he is getting it otc 20 mg  That is working fairly well  occ has to take 2 of them   Lifted something heavy this weekend and felt a pull in groin on L  No bulge   Dislikes wearing a mask- feels hot/ makes him feel like he has to take a deeper breath  He smoked from age 65 to early 13s  He smokes marijuana (vapes) -is very careful    Patient Active Problem List   Diagnosis Date Noted  . Routine general medical examination at a health care facility 08/25/2019  . Elevated glucose 05/07/2017  . Elevated ALT measurement 05/07/2017  . Hyperlipidemia, mild 01/15/2015  . GERD 02/24/2010   Past Medical History:  Diagnosis Date  . Borderline hyperlipidemia   . GERD (gastroesophageal reflux disease)   . History of syncope    2013-- work-up done , no idiology.   . Osteomyelitis of finger (HCC)  left thumb  . Seasonal allergies   . Syncope and collapse    Past Surgical History:  Procedure Laterality Date  . INCISION AND DRAINAGE OF WOUND Left 09/02/2014   Procedure: LEFT THUMB DEBRIDEMENT  AND BONE CURETTAGE;  Surgeon: Sharma Covert, MD;  Location: [facility];  Service: Orthopedics;  Laterality: Left;  . TONSILLECTOMY  as child  . TRANSTHORACIC ECHOCARDIOGRAM  10-03-2011   normal LV, ef 50-55%, mild MR, mild LAE   Social History   Tobacco Use  . Smoking status: Former Games developer  . Smokeless tobacco: Never Used  . Tobacco comment: quit 20 years   Substance Use Topics  . Alcohol use: Yes    Alcohol/week: 10.0 standard drinks    Types: 10 Cans of beer per week    Comment: occ  . Drug use: Yes    Types: Marijuana    Comment: Marijuana - "average one joint daily"   Family History  Problem Relation Age of Onset  . Anxiety disorder Mother   .  Heart attack Father   . Cancer Father        cancer  . Pancreatic cancer Paternal Uncle   . Kidney cancer Neg Hx   . Prostate cancer Neg Hx    No Known Allergies Current Outpatient Medications on File Prior to Visit  Medication Sig Dispense Refill  . fluticasone (FLONASE) 50 MCG/ACT nasal spray Place 2 sprays into both nostrils daily. (Patient taking differently: Place 2 sprays into both nostrils daily as needed. ) 16 g 1  . omeprazole (PRILOSEC OTC) 20 MG tablet Take 20 mg by mouth daily.     No current facility-administered medications on file prior to visit.    Review of Systems  Constitutional: Negative for activity change, appetite change, fatigue, fever and unexpected weight change.  HENT: Negative for congestion, rhinorrhea, sore throat and trouble swallowing.   Eyes: Negative for pain, redness, itching and visual disturbance.  Respiratory: Negative for cough, chest tightness, shortness of breath and wheezing.        Does not feel like he gets enough air with a mask in general   Cardiovascular: Negative for chest pain and palpitations.  Gastrointestinal: Negative for abdominal pain, blood in stool, constipation, diarrhea and nausea.  Endocrine: Negative for cold intolerance, heat intolerance, polydipsia and polyuria.  Genitourinary: Negative for difficulty urinating, dysuria, frequency and urgency.  Musculoskeletal: Negative for arthralgias, joint swelling and myalgias.       Felt pull in L groin when heavy lifting this weekend  Improve now  No bulge  Skin: Negative for pallor and rash.  Neurological: Negative for dizziness, tremors, weakness, numbness and headaches.  Hematological: Negative for adenopathy. Does not bruise/bleed easily.  Psychiatric/Behavioral: Negative for decreased concentration and dysphoric mood. The patient is not nervous/anxious.        Objective:   Physical Exam Constitutional:      General: He is not in acute distress.    Appearance: Normal  appearance. He is well-developed. He is obese. He is not ill-appearing or diaphoretic.     Comments: Overweight- but also very muscular build  HENT:     Head: Normocephalic and atraumatic.     Right Ear: Tympanic membrane, ear canal and external ear normal.     Left Ear: Tympanic membrane, ear canal and external ear normal.     Nose: Nose normal. No congestion.     Mouth/Throat:     Mouth: Mucous membranes are moist.  Pharynx: Oropharynx is clear. No posterior oropharyngeal erythema.  Eyes:     General: No scleral icterus.       Right eye: No discharge.        Left eye: No discharge.     Conjunctiva/sclera: Conjunctivae normal.     Pupils: Pupils are equal, round, and reactive to light.  Neck:     Thyroid: No thyromegaly.     Vascular: No carotid bruit or JVD.  Cardiovascular:     Rate and Rhythm: Normal rate and regular rhythm.     Pulses: Normal pulses.     Heart sounds: Normal heart sounds. No gallop.   Pulmonary:     Effort: Pulmonary effort is normal. No respiratory distress.     Breath sounds: Normal breath sounds. No wheezing or rales.     Comments: Good air exch Chest:     Chest wall: No tenderness.  Abdominal:     General: Bowel sounds are normal. There is no distension or abdominal bruit.     Palpations: Abdomen is soft. There is no mass.     Tenderness: There is no abdominal tenderness.     Hernia: No hernia is present.  Genitourinary:    Comments: No testicular masses  No hernia noted  Musculoskeletal:        General: No tenderness.     Cervical back: Normal range of motion and neck supple. No rigidity. No muscular tenderness.     Right lower leg: No edema.     Left lower leg: No edema.  Lymphadenopathy:     Cervical: No cervical adenopathy.  Skin:    General: Skin is warm and dry.     Coloration: Skin is not pale.     Findings: No erythema or rash.     Comments: Solar lentigines diffusely Fair complexion   Neurological:     Mental Status: He is  alert.     Cranial Nerves: No cranial nerve deficit.     Motor: No abnormal muscle tone.     Coordination: Coordination normal.     Gait: Gait normal.     Deep Tendon Reflexes: Reflexes are normal and symmetric. Reflexes normal.  Psychiatric:        Mood and Affect: Mood normal.        Cognition and Memory: Cognition and memory normal.     Comments: Pleasant            Assessment & Plan:   Problem List Items Addressed This Visit      Digestive   GERD    Taking omeprazole 20 mg daily otc As long as he watches what he eats - is fairly well controlled       Relevant Medications   omeprazole (PRILOSEC OTC) 20 MG tablet     Other   Hyperlipidemia, mild    Labs today Disc goals for lipids and reasons to control them Rev last labs with pt Rev low sat fat diet in detail Diet reviewed - recommended less trans fats and shellfish      Relevant Orders   Lipid panel   Elevated glucose    A1C with labs today  Wt is stable  disc imp of low glycemic diet and wt loss to prevent DM2       Relevant Orders   Hemoglobin A1c   Routine general medical examination at a health care facility - Primary    Reviewed health habits including diet and exercise and skin cancer prevention Reviewed  appropriate screening tests for age  Also reviewed health mt list, fam hx and immunization status , as well as social and family history   See HPI Labs ordered  BP better on 2nd check BP: 136/80  Pt declines flu vaccine  Discussed safety during pandemic (uncomfortable with mask but still wears it)       Relevant Orders   CBC with Differential/Platelet   Comprehensive metabolic panel   Lipid panel   TSH

## 2019-08-25 NOTE — Patient Instructions (Addendum)
2 year dot card. Continue routine follow up with primary provider.    If you have lab work done today you will be contacted with your lab results within the next 2 weeks.  If you have not heard from [place] then please contact us. The fastest way to get your results is to register for My Chart.   IF you received an x-ray today, you will receive an invoice from [facility]. Please contact [facility] at [phone] with questions or concerns regarding your invoice.   IF you received labwork today, you will receive an invoice from [facility]. Please contact [facility] at [phone] with questions or concerns regarding your invoice.   Our billing staff will not be able to assist you with questions regarding bills from these companies.  You will be contacted with the lab results as soon as they are available. The fastest way to get your results is to activate your My Chart account. Instructions are located on the last page of this paperwork. If you have not heard from [place] regarding the results in 2 weeks, please contact this office.

## 2019-08-25 NOTE — Assessment & Plan Note (Signed)
Reviewed health habits including diet and exercise and skin cancer prevention Reviewed appropriate screening tests for age  Also reviewed health mt list, fam hx and immunization status , as well as social and family history   See HPI Labs ordered  BP better on 2nd check BP: 136/80  Pt declines flu vaccine  Discussed safety during pandemic (uncomfortable with mask but still wears it)

## 2019-08-25 NOTE — Patient Instructions (Addendum)
For cholesterol  Avoid red meat/ fried foods/ egg yolks/ fatty breakfast meats/ butter, cheese and high fat dairy/ and shellfish    For diabetes prevention  Try to get most of your carbohydrates from produce (with the exception of white potatoes)  Eat less bread/pasta/rice/snack foods/cereals/sweets and other items from the middle of the grocery store (processed carbs)   Labs today  I do not detect a hernia - but if the groin discomfort continues or you notice a bulge or other change please let us know

## 2019-08-26 ENCOUNTER — Encounter: Payer: Self-pay | Admitting: *Deleted

## 2022-03-03 ENCOUNTER — Ambulatory Visit: Payer: Self-pay | Admitting: Family Medicine

## 2022-03-21 ENCOUNTER — Ambulatory Visit: Payer: Self-pay | Admitting: Nurse Practitioner

## 2022-09-10 ENCOUNTER — Ambulatory Visit (INDEPENDENT_AMBULATORY_CARE_PROVIDER_SITE_OTHER): Payer: PRIVATE HEALTH INSURANCE

## 2022-09-10 ENCOUNTER — Ambulatory Visit
Admission: EM | Admit: 2022-09-10 | Discharge: 2022-09-10 | Disposition: A | Discharge status: Home / Self Care | Payer: PRIVATE HEALTH INSURANCE | Attending: Emergency Medicine | Admitting: Emergency Medicine

## 2022-09-10 DIAGNOSIS — J209 Acute bronchitis, unspecified: Secondary | ICD-10-CM | POA: Diagnosis not present

## 2022-09-10 DIAGNOSIS — H6691 Otitis media, unspecified, right ear: Secondary | ICD-10-CM

## 2022-09-10 DIAGNOSIS — R058 Other specified cough: Secondary | ICD-10-CM

## 2022-09-10 DIAGNOSIS — J029 Acute pharyngitis, unspecified: Secondary | ICD-10-CM

## 2022-09-10 DIAGNOSIS — R110 Nausea: Secondary | ICD-10-CM

## 2022-09-10 LAB — POCT FASTING CBG KUC MANUAL ENTRY: POCT Glucose (KUC): 130 mg/dL — AB (ref 70–99)

## 2022-09-10 MED ORDER — IBUPROFEN 400 MG PO TABS
400.0000 mg | ORAL_TABLET | ORAL | Status: AC
  Administered 2022-09-10: 400 mg via ORAL

## 2022-09-10 MED ORDER — AMOXICILLIN-POT CLAVULANATE 875-125 MG PO TABS: 1.0000 | ORAL_TABLET | Freq: Two times a day (BID) | ORAL | 0 refills | Status: AC

## 2022-09-10 NOTE — Discharge Instructions (Addendum)
Take the Augmentin as directed.  Follow up with your primary care provider tomorrow.

## 2022-09-10 NOTE — ED Triage Notes (Addendum)
Patient to Urgent Care with complaints of nasal congestion/ cough that started Monday  Tuesday started having fevers. Reports it has consistently been around 100.   Reports a lot of congestion in his chest which is causing a lot of discomfort when he coughs. Cough is productive with discolored mucus.   Taking otc cold meds/ tylenol.

## 2022-09-10 NOTE — ED Provider Notes (Signed)
Roderic Palau    CSN: [id number] Arrival date & time: 09/10/22  1146      History   Chief Complaint Chief Complaint  Patient presents with   Nasal Congestion   Cough    HPI Ralph Oliver is a 47 y.o. male.  Patient presents with fever, ear pain, sore throat, congestion, productive cough, nausea, diarrhea x 6 days. Tmax 100.7.   Took Tylenol this morning at 0800.  One episode of diarrhea today.  No chest pain, shortness of breath, vomiting, or other symptoms.      The history is provided by the patient and medical records.    Past Medical History:  Diagnosis Date   Borderline hyperlipidemia    GERD (gastroesophageal reflux disease)    History of syncope    2013-- work-up done , no idiology.    Osteomyelitis of finger (HCC)    left thumb   Seasonal allergies    Syncope and collapse     Patient Active Problem List   Diagnosis Date Noted   Routine general medical examination at a health care facility 08/25/2019   Elevated glucose 05/07/2017   Elevated ALT measurement 05/07/2017   Hyperlipidemia, mild 01/15/2015   GERD 02/24/2010    Past Surgical History:  Procedure Laterality Date   INCISION AND DRAINAGE OF WOUND Left 09/02/2014   Procedure: LEFT THUMB DEBRIDEMENT  AND BONE CURETTAGE;  Surgeon: Linna Hoff, MD;  Location: [facility];  Service: Orthopedics;  Laterality: Left;   TONSILLECTOMY  as child   TRANSTHORACIC ECHOCARDIOGRAM  10-03-2011   normal LV, ef 50-55%, mild MR, mild LAE       Home Medications    Prior to Admission medications   Medication Sig Start Date End Date Taking? Authorizing Provider  amoxicillin-clavulanate (AUGMENTIN) 875-125 MG tablet Take 1 tablet by mouth every 12 (twelve) hours for 10 days. 09/10/22 09/20/22 Yes Sharion Balloon, NP  fluticasone (FLONASE) 50 MCG/ACT nasal spray Place 2 sprays into both nostrils daily. Patient not taking: Reported on 08/25/2019 01/05/17   Ria Bush, MD  omeprazole (PRILOSEC  OTC) 20 MG tablet Take 20 mg by mouth daily.    [provider]    Family History Family History  Problem Relation Age of Onset   Anxiety disorder Mother    Heart attack Father    Cancer Father        cancer   Pancreatic cancer Paternal Uncle    Kidney cancer Neg Hx    Prostate cancer Neg Hx     Social History Social History   Tobacco Use   Smoking status: Former   Smokeless tobacco: Never   Tobacco comments:    quit 20 years   Substance Use Topics   Alcohol use: Yes    Alcohol/week: 10.0 standard drinks of alcohol    Types: 10 Cans of beer per week    Comment: occ   Drug use: Yes    Types: Marijuana    Comment: Marijuana - "average one joint daily"     Allergies   Patient has no known allergies.   Review of Systems Review of Systems  Constitutional:  Positive for fever. Negative for chills.  HENT:  Positive for congestion, ear pain and sore throat.   Respiratory:  Positive for cough. Negative for shortness of breath.   Cardiovascular:  Negative for chest pain and palpitations.  Gastrointestinal:  Positive for diarrhea and nausea. Negative for abdominal pain and vomiting.  Skin:  Negative  for color change and rash.  All other systems reviewed and are negative.    Physical Exam Triage Vital Signs ED Triage Vitals  Enc Vitals Group     BP 09/10/22 1206 (!) 132/91     Pulse Rate 09/10/22 1206 89     Resp 09/10/22 1206 18     Temp 09/10/22 1206 (!) 100.7 F (38.2 C)     Temp src --      SpO2 09/10/22 1206 97 %     Weight --      Height --      Head Circumference --      Peak Flow --      Pain Score 09/10/22 1239 8     Pain Loc --      Pain Edu? --      Excl. in [place]? --    No data found.  Updated Vital Signs BP (!) 130/94   Pulse 87   Temp (!) 100.7 F (38.2 C)   Resp 17   SpO2 97%   Visual Acuity Right Eye Distance:   Left Eye Distance:   Bilateral Distance:    Right Eye Near:   Left Eye Near:    Bilateral Near:     Physical  Exam Vitals and nursing note reviewed.  Constitutional:      General: He is not in acute distress.    Appearance: Normal appearance. He is well-developed. He is not ill-appearing.  HENT:     Right Ear: Tympanic membrane is erythematous.     Left Ear: Tympanic membrane and ear canal normal.     Nose: Nose normal.     Mouth/Throat:     Mouth: Mucous membranes are moist.     Pharynx: Posterior oropharyngeal erythema present.  Cardiovascular:     Rate and Rhythm: Normal rate and regular rhythm.     Heart sounds: Normal heart sounds.  Pulmonary:     Effort: Pulmonary effort is normal. No respiratory distress.     Breath sounds: Rhonchi present.  Musculoskeletal:     Cervical back: Neck supple.  Skin:    General: Skin is warm and dry.  Neurological:     Mental Status: He is alert.  Psychiatric:        Mood and Affect: Mood normal.        Behavior: Behavior normal.      UC Treatments / Results  Labs (all labs ordered are listed, but only abnormal results are displayed) Labs Reviewed  POCT FASTING CBG [facility] MANUAL ENTRY - Abnormal; Notable for the following components:      Result Value   POCT Glucose ([facility]) 130 (*)    All other components within normal limits    EKG   Radiology DG Chest 2 View  Result Date: 09/10/2022 CLINICAL DATA:  Productive cough EXAM: CHEST - 2 VIEW COMPARISON:  02/08/2016 FINDINGS: The heart size and mediastinal contours are within normal limits. Both lungs are clear. The visualized skeletal structures are unremarkable. IMPRESSION: No active cardiopulmonary disease. Electronically Signed   By: Kathreen Devoid M.D.   On: 09/10/2022 13:15    Procedures Procedures (including critical care time)  Medications Ordered in UC Medications  ibuprofen (ADVIL) tablet 400 mg (400 mg Oral Given 09/10/22 1242)    Initial Impression / Assessment and Plan / UC Course  I have reviewed the triage vital signs and the nursing notes.  Pertinent labs & imaging results  that were available during my care of the  patient were reviewed by me and considered in my medical decision making (see chart for details).    Productive cough, acute bronchitis, right otitis media, acute pharyngitis.  X-ray negative.  Treating with 10-day course of Augmentin.  Tylenol or ibuprofen as needed.  Directed patient to follow-up with his PCP tomorrow.  Education provided on bronchitis, otitis media, pharyngitis.  He agrees to plan of care.  Final Clinical Impressions(s) / UC Diagnoses   Final diagnoses:  Productive cough  Acute bronchitis, unspecified organism  Right otitis media, unspecified otitis media type  Acute pharyngitis, unspecified etiology     Discharge Instructions      Take the Augmentin as directed.  Follow up with your primary care provider tomorrow.        ED Prescriptions     Medication Sig Dispense Auth. Provider   amoxicillin-clavulanate (AUGMENTIN) 875-125 MG tablet Take 1 tablet by mouth every 12 (twelve) hours for 10 days. 20 tablet Sharion Balloon, NP      I have reviewed the PDMP during this encounter.   Sharion Balloon, NP 09/10/22 1345

## 2024-04-03 ENCOUNTER — Telehealth: Payer: Self-pay | Admitting: *Deleted

## 2024-04-03 NOTE — Telephone Encounter (Signed)
 That is fine

## 2024-04-03 NOTE — Telephone Encounter (Signed)
 Copied from CRM #[id number]. Topic: Appointments - Appointment Scheduling >> Apr 03, 2024  2:27 PM Rea ORN wrote: Patient is calling to schedule an appointment. He has not been seen by Dr. Randeen since 2021 and would like to re-establish, get a physical and have STD testing done. Please advise if he can establish with her.

## 2024-04-04 NOTE — Telephone Encounter (Signed)
 I called patient and he has been scheduled.

## 2024-04-08 ENCOUNTER — Encounter: Payer: Self-pay | Admitting: Family Medicine

## 2024-04-08 ENCOUNTER — Ambulatory Visit (INDEPENDENT_AMBULATORY_CARE_PROVIDER_SITE_OTHER): Admitting: Family Medicine

## 2024-04-08 VITALS — BP 118/62 | HR 96 | Temp 98.7°F | Ht 70.0 in | Wt 225.0 lb

## 2024-04-08 DIAGNOSIS — K219 Gastro-esophageal reflux disease without esophagitis: Secondary | ICD-10-CM

## 2024-04-08 DIAGNOSIS — Z79899 Other long term (current) drug therapy: Secondary | ICD-10-CM

## 2024-04-08 DIAGNOSIS — K429 Umbilical hernia without obstruction or gangrene: Secondary | ICD-10-CM | POA: Insufficient documentation

## 2024-04-08 DIAGNOSIS — R7303 Prediabetes: Secondary | ICD-10-CM | POA: Diagnosis not present

## 2024-04-08 DIAGNOSIS — Z113 Encounter for screening for infections with a predominantly sexual mode of transmission: Secondary | ICD-10-CM | POA: Diagnosis not present

## 2024-04-08 DIAGNOSIS — E785 Hyperlipidemia, unspecified: Secondary | ICD-10-CM

## 2024-04-08 DIAGNOSIS — Z0000 Encounter for general adult medical examination without abnormal findings: Secondary | ICD-10-CM

## 2024-04-08 DIAGNOSIS — Z1211 Encounter for screening for malignant neoplasm of colon: Secondary | ICD-10-CM | POA: Diagnosis not present

## 2024-04-08 NOTE — Assessment & Plan Note (Signed)
 B12 level today  Used to take B12- no longer taking Takes vitamin D daily

## 2024-04-08 NOTE — Assessment & Plan Note (Signed)
Disc goals for lipids and reasons to control them ?Rev last labs with pt ?Rev low sat fat diet in detail ?Labs today  ?Diet is fair  ?

## 2024-04-08 NOTE — Assessment & Plan Note (Signed)
 Reviewed health habits including diet and exercise and skin cancer prevention Reviewed appropriate screening tests for age  Also reviewed health mt list, fam hx and immunization status , as well as social and family history   See HPI Labs reviewed and ordered Health Maintenance  Topic Date Due   Hepatitis C Screening  Never done   Hepatitis B Vaccine (2 of 3 - 3-dose series) 03/05/1997   HPV Vaccine (1 - 3-dose SCDM series) Never done   Colon Cancer Screening  Never done   Flu Shot  10/28/2024*   DTaP/Tdap/Td vaccine (3 - Tdap) 04/08/2025*   COVID-19 Vaccine (1 - 2024-25 season) 04/24/2025*   HIV Screening  Completed   Pneumococcal Vaccine  Aged Out   Meningitis B Vaccine  Aged Out  *Topic was postponed. The date shown is not the original due date.    Due for tetanus shot -will get here after new ins card or at health dept Declines flu shot  STD screening today  Discussed fall prevention, supplements and exercise for bone density   Labs today  Reviewed PHQ

## 2024-04-08 NOTE — Assessment & Plan Note (Signed)
 Continues omeprazole 20 mg daily   Encouraged to avoid triggers

## 2024-04-08 NOTE — Progress Notes (Signed)
 Subjective:    Patient ID: Ralph Oliver, male    DOB: 1976/07/16, 48 y.o.   MRN: [id number]  HPI  Here for health maintenance exam and to review chronic medical problems   Wt Readings from Last 3 Encounters:  04/08/24 225 lb (102.1 kg)  08/25/19 235 lb 9.6 oz (106.9 kg)  08/25/19 232 lb 7 oz (105.4 kg)   32.28 kg/m  Vitals:   04/08/24 1601  BP: 118/62  Pulse: 96  Temp: 98.7 F (37.1 C)  SpO2: 97%    Immunization History  Administered Date(s) Administered   Hepatitis B 02/05/1997   MMR 02/16/1997   Td 02/05/1997, 02/24/2010    Health Maintenance Due  Topic Date Due   Hepatitis C Screening  Never done   Hepatitis B Vaccines 19-59 Average Risk (2 of 3 - 3-dose series) 03/05/1997   HPV VACCINES (1 - 3-dose SCDM series) Never done   Colonoscopy  Never done   Medicaid- needs imms outside of the clinic  Flu- has not been getting  Tetanus shot 2011    Prostate health No prostate cancer that he knows of   STD screening  No symptoms   Colon cancer screening - none in family   Bone health   Falls- one  tripped on hose at gas station / 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  Fractures-none  Supplements  Vitamin D daily  Occational vitamin C    Exercise - heavy lifting at work but none additional   Does smoke/vape marijuana regularly  No breathing problems    Mood    08/25/2019    1:59 PM 08/25/2019   12:04 PM 04/18/2017   12:52 PM  Depression screen PHQ 2/9  Decreased Interest 0 0 1  Down, Depressed, Hopeless 0 1 1  PHQ - 2 Score 0 1 2  Altered sleeping  0 1  Tired, decreased energy  1 1  Change in appetite  1 1  Feeling bad or failure about yourself   1 1  Trouble concentrating  0 1  Moving slowly or fidgety/restless  0 0  Suicidal thoughts  0 0  PHQ-9 Score  4 7  Difficult doing work/chores   Somewhat difficult   GERD Omeprazole  daily   History of elevated glucose Lab Results  Component Value Date   HGBA1C 5.9 08/25/2019   HGBA1C 5.8 04/30/2018   HGBA1C 6.0  05/07/2017    History of hyperlipidemia Lab Results  Component Value Date   CHOL 200 08/25/2019   HDL 33.30 (L) 08/25/2019   LDLCALC 137 (H) 08/25/2019   LDLDIRECT 124.0 04/30/2018   TRIG 149.0 08/25/2019   CHOLHDL 6 08/25/2019   Due for labs     Patient Active Problem List   Diagnosis Date Noted   Colon cancer screening 04/08/2024   Screening examination for STD (sexually transmitted disease) 04/08/2024   Current use of proton pump inhibitor 04/08/2024   Umbilical hernia 04/08/2024   Routine general medical examination at a health care facility 08/25/2019   Prediabetes 05/07/2017   Elevated ALT measurement 05/07/2017   Hyperlipidemia, mild 01/15/2015   GERD 02/24/2010   Past Medical History:  Diagnosis Date   Borderline hyperlipidemia    GERD (gastroesophageal reflux disease)    History of syncope    2013-- work-up done , no idiology.    Osteomyelitis of finger (HCC)    left thumb   Seasonal allergies    Syncope and collapse    Past Surgical History:  Procedure Laterality Date  INCISION AND DRAINAGE OF WOUND Left 09/02/2014   Procedure: LEFT THUMB DEBRIDEMENT  AND BONE CURETTAGE;  Surgeon: Prentice LELON Pagan, MD;  Location: [facility];  Service: Orthopedics;  Laterality: Left;   TONSILLECTOMY  as child   TRANSTHORACIC ECHOCARDIOGRAM  10-03-2011   normal LV, ef 50-55%, mild MR, mild LAE   Social History   Tobacco Use   Smoking status: Former   Smokeless tobacco: Never   Tobacco comments:    quit 20 years   Substance Use Topics   Alcohol use: Yes    Alcohol/week: 10.0 standard drinks of alcohol    Types: 10 Cans of beer per week    Comment: occ   Drug use: Yes    Types: Marijuana    Comment: Marijuana - average one joint daily   Family History  Problem Relation Age of Onset   Anxiety disorder Mother    Heart attack Father    Cancer Father        cancer   Pancreatic cancer Paternal Uncle    Kidney cancer Neg Hx    Prostate cancer Neg  Hx    No Known Allergies Current Outpatient Medications on File Prior to Visit  Medication Sig Dispense Refill   omeprazole  (PRILOSEC OTC) 20 MG tablet Take 20 mg by mouth daily.     No current facility-administered medications on file prior to visit.    Review of Systems  Constitutional:  Negative for activity change, appetite change, fatigue, fever and unexpected weight change.  HENT:  Negative for congestion, rhinorrhea, sore throat and trouble swallowing.   Eyes:  Negative for pain, redness, itching and visual disturbance.  Respiratory:  Negative for cough, chest tightness, shortness of breath and wheezing.   Cardiovascular:  Negative for chest pain and palpitations.  Gastrointestinal:  Negative for abdominal pain, blood in stool, constipation, diarrhea and nausea.  Endocrine: Negative for cold intolerance, heat intolerance, polydipsia and polyuria.  Genitourinary:  Negative for difficulty urinating, dysuria, frequency and urgency.  Musculoskeletal:  Negative for arthralgias, joint swelling and myalgias.       Some pain in left heel   Occational right 5th toe is tingly   Skin:  Negative for pallor and rash.  Neurological:  Negative for dizziness, tremors, weakness, numbness and headaches.  Hematological:  Negative for adenopathy. Does not bruise/bleed easily.  Psychiatric/Behavioral:  Negative for decreased concentration and dysphoric mood. The patient is not nervous/anxious.        Objective:   Physical Exam Constitutional:      General: He is not in acute distress.    Appearance: Normal appearance. He is well-developed. He is obese. He is not ill-appearing or diaphoretic.  HENT:     Head: Normocephalic and atraumatic.     Right Ear: Tympanic membrane, ear canal and external ear normal.     Left Ear: Tympanic membrane, ear canal and external ear normal.     Nose: Nose normal. No congestion.     Mouth/Throat:     Mouth: Mucous membranes are moist.     Pharynx: Oropharynx  is clear. No posterior oropharyngeal erythema.  Eyes:     General: No scleral icterus.       Right eye: No discharge.        Left eye: No discharge.     Conjunctiva/sclera: Conjunctivae normal.     Pupils: Pupils are equal, round, and reactive to light.  Neck:     Thyroid: No thyromegaly.     Vascular:  No carotid bruit or JVD.  Cardiovascular:     Rate and Rhythm: Normal rate and regular rhythm.     Pulses: Normal pulses.     Heart sounds: Normal heart sounds.     No gallop.  Pulmonary:     Effort: Pulmonary effort is normal. No respiratory distress.     Breath sounds: Normal breath sounds. No wheezing or rales.     Comments: Good air exch Chest:     Chest wall: No tenderness.  Abdominal:     General: Bowel sounds are normal. There is no distension or abdominal bruit.     Palpations: Abdomen is soft. There is no mass.     Tenderness: There is no abdominal tenderness.     Hernia: No hernia is present.     Comments: Small umbilical hernia  Reducible  Non tender   Musculoskeletal:        General: No tenderness.     Cervical back: Normal range of motion and neck supple. No rigidity. No muscular tenderness.     Right lower leg: No edema.     Left lower leg: No edema.  Lymphadenopathy:     Cervical: No cervical adenopathy.  Skin:    General: Skin is warm and dry.     Coloration: Skin is not pale.     Findings: No erythema or rash.     Comments: Solar lentigines diffusely   Neurological:     Mental Status: He is alert.     Cranial Nerves: No cranial nerve deficit.     Motor: No abnormal muscle tone.     Coordination: Coordination normal.     Gait: Gait normal.     Deep Tendon Reflexes: Reflexes are normal and symmetric. Reflexes normal.  Psychiatric:        Mood and Affect: Mood normal.        Cognition and Memory: Cognition and memory normal.           Assessment & Plan:   Problem List Items Addressed This Visit       Digestive   GERD   Continues omeprazole   20 mg daily  Encouraged to avoid triggers         Other   Umbilical hernia   Painful at times with straining   Refer for gen surg eval         Screening examination for STD (sexually transmitted disease)   STD screening today No symptoms  Recommend safe sexual practices       Relevant Orders   C. trachomatis/N. gonorrhoeae RNA   Hepatitis C antibody   HIV Antibody (routine testing w rflx)   RPR   Routine general medical examination at a health care facility - Primary   Reviewed health habits including diet and exercise and skin cancer prevention Reviewed appropriate screening tests for age  Also reviewed health mt list, fam hx and immunization status , as well as social and family history   See HPI Labs reviewed and ordered Health Maintenance  Topic Date Due   Hepatitis C Screening  Never done   Hepatitis B Vaccine (2 of 3 - 3-dose series) 03/05/1997   HPV Vaccine (1 - 3-dose SCDM series) Never done   Colon Cancer Screening  Never done   Flu Shot  10/28/2024*   DTaP/Tdap/Td vaccine (3 - Tdap) 04/08/2025*   COVID-19 Vaccine (1 - 2024-25 season) 04/24/2025*   HIV Screening  Completed   Pneumococcal Vaccine  Aged Out  Meningitis B Vaccine  Aged Out  *Topic was postponed. The date shown is not the original due date.    Due for tetanus shot -will get here after new ins card or at health dept Declines flu shot  STD screening today  Discussed fall prevention, supplements and exercise for bone density   Labs today  Reviewed PHQ        Relevant Orders   TSH   Lipid panel   Comprehensive metabolic panel with GFR   CBC with Differential/Platelet   Prediabetes   A1c today  disc imp of low glycemic diet and wt loss to prevent DM2       Relevant Orders   Hemoglobin A1c   Hyperlipidemia, mild   Disc goals for lipids and reasons to control them Rev last labs with pt Rev low sat fat diet in detail   Labs today  Diet is fair       Relevant Orders   Lipid  panel   Comprehensive metabolic panel with GFR   Current use of proton pump inhibitor   B12 level today  Used to take B12- no longer taking Takes vitamin D daily      Relevant Orders   Vitamin B12   Colon cancer screening   Discussed options  Is 28 Order done for cologuard        Relevant Orders   Cologuard

## 2024-04-08 NOTE — Patient Instructions (Addendum)
 You are due for a tetanus shot  We can give that to you when insurance is situated or you can get it at the health department   Labs today   I will order cologuard Please let us  know if you don't hear in 1-2 weeks to set that up (mychart message or call or letter) - usually a message from the company    I put the referral in for a general surgeon for the umbilical hernia  Please let us  know if you don't hear in 1-2 weeks to set that up (mychart message or call or letter)  If symptoms suddenly worsen (pain or hernia pops out and will not reduce) - please go to ER     For cholesterol Avoid red meat/ fried foods/ egg yolks/ fatty breakfast meats/ butter, cheese and high fat dairy/ and shellfish   For diabetes prevention Avoid added sugars in diet when able Try to get most of your carbohydrates from produce (with the exception of white potatoes) and whole grains Eat less bread/pasta/rice/snack foods/cereals/sweets and other items from the middle of the grocery store (processed carbs)  Follow up if foot issues worsen  Follow up if you feel like mood is being problematic

## 2024-04-08 NOTE — Assessment & Plan Note (Signed)
 Discussed options  Is 57 Order done for cologuard

## 2024-04-08 NOTE — Assessment & Plan Note (Signed)
 A1c today  disc imp of low glycemic diet and wt loss to prevent DM2

## 2024-04-08 NOTE — Assessment & Plan Note (Signed)
 STD screening today No symptoms  Recommend safe sexual practices

## 2024-04-08 NOTE — Assessment & Plan Note (Signed)
 Painful at times with straining   Refer for gen surg eval

## 2024-04-09 ENCOUNTER — Ambulatory Visit: Payer: Self-pay | Admitting: Family Medicine

## 2024-04-09 DIAGNOSIS — N529 Male erectile dysfunction, unspecified: Secondary | ICD-10-CM

## 2024-04-09 DIAGNOSIS — R7303 Prediabetes: Secondary | ICD-10-CM

## 2024-04-09 DIAGNOSIS — E785 Hyperlipidemia, unspecified: Secondary | ICD-10-CM

## 2024-04-09 LAB — CBC WITH DIFFERENTIAL/PLATELET
Basophils Absolute: 0 K/uL (ref 0.0–0.1)
Basophils Relative: 0.6 % (ref 0.0–3.0)
Eosinophils Absolute: 0.1 K/uL (ref 0.0–0.7)
Eosinophils Relative: 1.4 % (ref 0.0–5.0)
HCT: 45.7 % (ref 39.0–52.0)
Hemoglobin: 15.3 g/dL (ref 13.0–17.0)
Lymphocytes Relative: 41.3 % (ref 12.0–46.0)
Lymphs Abs: 2.1 K/uL (ref 0.7–4.0)
MCHC: 33.5 g/dL (ref 30.0–36.0)
MCV: 86.5 fl (ref 78.0–100.0)
Monocytes Absolute: 0.3 K/uL (ref 0.1–1.0)
Monocytes Relative: 6.1 % (ref 3.0–12.0)
Neutro Abs: 2.6 K/uL (ref 1.4–7.7)
Neutrophils Relative %: 50.6 % (ref 43.0–77.0)
Platelets: 171 K/uL (ref 150.0–400.0)
RBC: 5.28 Mil/uL (ref 4.22–5.81)
RDW: 14.3 % (ref 11.5–15.5)
WBC: 5.1 K/uL (ref 4.0–10.5)

## 2024-04-09 LAB — COMPREHENSIVE METABOLIC PANEL WITH GFR
ALT: 17 U/L (ref 0–53)
AST: 14 U/L (ref 0–37)
Albumin: 4.9 g/dL (ref 3.5–5.2)
Alkaline Phosphatase: 66 U/L (ref 39–117)
BUN: 16 mg/dL (ref 6–23)
CO2: 26 meq/L (ref 19–32)
Calcium: 9.6 mg/dL (ref 8.4–10.5)
Chloride: 106 meq/L (ref 96–112)
Creatinine, Ser: 1.05 mg/dL (ref 0.40–1.50)
GFR: 85.53 mL/min (ref >60.00)
Glucose, Bld: 116 mg/dL — ABNORMAL HIGH (ref 70–99)
Potassium: 3.8 meq/L (ref 3.5–5.1)
Sodium: 140 meq/L (ref 135–145)
Total Bilirubin: 0.5 mg/dL (ref 0.2–1.2)
Total Protein: 7.2 g/dL (ref 6.0–8.3)

## 2024-04-09 LAB — LIPID PANEL
Cholesterol: 186 mg/dL (ref 0–200)
HDL: 33.6 mg/dL — ABNORMAL LOW (ref >39.00)
LDL Cholesterol: 102 mg/dL — ABNORMAL HIGH (ref 0–99)
NonHDL: 151.98
Total CHOL/HDL Ratio: 6
Triglycerides: 249 mg/dL — ABNORMAL HIGH (ref 0.0–149.0)
VLDL: 49.8 mg/dL — ABNORMAL HIGH (ref 0.0–40.0)

## 2024-04-09 LAB — VITAMIN B12: Vitamin B-12: 310 pg/mL (ref 211–911)

## 2024-04-09 LAB — HEMOGLOBIN A1C: Hgb A1c MFr Bld: 6 % (ref 4.6–6.5)

## 2024-04-09 LAB — TSH: TSH: 0.86 u[IU]/mL (ref 0.35–5.50)

## 2024-04-10 LAB — RPR: RPR Ser Ql: NONREACTIVE

## 2024-04-10 LAB — HIV ANTIBODY (ROUTINE TESTING W REFLEX)
HIV 1&2 Ab, 4th Generation: NONREACTIVE
HIV FINAL INTERPRETATION: NEGATIVE

## 2024-04-10 LAB — C. TRACHOMATIS/N. GONORRHOEAE RNA
C. trachomatis RNA, TMA: NOT DETECTED
N. gonorrhoeae RNA, TMA: NOT DETECTED

## 2024-04-10 LAB — HEPATITIS C ANTIBODY: Hepatitis C Ab: NONREACTIVE

## 2024-04-11 NOTE — Telephone Encounter (Signed)
 Last read by Abby GORMAN Pereyra at 7:09AM on 04/11/2024.

## 2024-04-21 DIAGNOSIS — N529 Male erectile dysfunction, unspecified: Secondary | ICD-10-CM | POA: Insufficient documentation

## 2024-04-21 NOTE — Addendum Note (Signed)
 Addended by: RANDEEN HARDY A on: 04/21/2024 07:52 PM   Modules accepted: Orders

## 2024-05-12 LAB — COLOGUARD: COLOGUARD: NEGATIVE

## 2024-05-13 ENCOUNTER — Encounter: Payer: Self-pay | Admitting: *Deleted

## 2024-05-15 ENCOUNTER — Ambulatory Visit (INDEPENDENT_AMBULATORY_CARE_PROVIDER_SITE_OTHER): Admitting: Urology

## 2024-05-15 ENCOUNTER — Encounter: Payer: Self-pay | Admitting: Urology

## 2024-05-15 VITALS — BP 147/86 | HR 97 | Ht 71.0 in | Wt 240.0 lb

## 2024-05-15 DIAGNOSIS — N5319 Other ejaculatory dysfunction: Secondary | ICD-10-CM

## 2024-05-15 DIAGNOSIS — N529 Male erectile dysfunction, unspecified: Secondary | ICD-10-CM | POA: Diagnosis not present

## 2024-05-15 DIAGNOSIS — N401 Enlarged prostate with lower urinary tract symptoms: Secondary | ICD-10-CM

## 2024-05-15 MED ORDER — TADALAFIL 5 MG PO TABS: 5.0000 mg | ORAL_TABLET | Freq: Every day | ORAL | 11 refills | Status: AC

## 2024-05-15 NOTE — Progress Notes (Signed)
 05/15/2024 11:06 PM   Oliver Ralph Oct 18, 1975 [id number]  Referring provider: Randeen Laine LABOR, MD [address] [place],  [place] [address]  Urological history: 1.  Erectile dysfunction  Chief Complaint  Patient presents with   Other   HPI: Ralph Oliver is a 48 y.o. man who presents today for erectile dysfunction  Previous records reviewed.  SHIM 21  He had sent a MyChart message to his primary care provider stating that he was having some instances when he was not having an erection firm enough for penetration and also when he was ejaculating not as much fluid was being expelled and he felt it had not been as strong as it had been in the past.  SHIM 21  He can achieve erections, but he notices they are not as firm as they have been in the past.  He also states that when he is having intercourse his refractory period was much shorter, but it is now a little bit more delayed.  He has also noticed that his urinary stream is getting weaker along with his ejaculations.  He also is noticing less ejaculate fluid.  Patient still having spontaneous erections.  He denies any pain or curvature with erections.    Testosterone level pending   Cholesterol triglycerides at 249 in September.  Hemoglobin A1c is 6 in September  TSH 0.86 in September  He has had two sisters with breast cancer.    PMH: Past Medical History:  Diagnosis Date   Borderline hyperlipidemia    GERD (gastroesophageal reflux disease)    History of syncope    2013-- work-up done , no idiology.    Osteomyelitis of finger (HCC)    left thumb   Seasonal allergies    Syncope and collapse     Surgical History: Past Surgical History:  Procedure Laterality Date   INCISION AND DRAINAGE OF WOUND Left 09/02/2014   Procedure: LEFT THUMB DEBRIDEMENT  AND BONE CURETTAGE;  Surgeon: Prentice LELON Pagan, MD;  Location: [facility];  Service: Orthopedics;  Laterality: Left;   TONSILLECTOMY  as child    TRANSTHORACIC ECHOCARDIOGRAM  10-03-2011   normal LV, ef 50-55%, mild MR, mild LAE    Home Medications:  Allergies as of 05/15/2024   No Known Allergies      Medication List        Accurate as of May 15, 2024 11:59 PM. If you have any questions, ask your nurse or doctor.          omeprazole  20 MG tablet Commonly known as: PRILOSEC OTC Take 20 mg by mouth daily.   tadalafil 5 MG tablet Commonly known as: CIALIS Take 1 tablet (5 mg total) by mouth daily. Started by: CLOTILDA CORNWALL        Allergies: No Known Allergies  Family History: Family History  Problem Relation Age of Onset   Anxiety disorder Mother    Heart attack Father    Cancer Father        cancer   Pancreatic cancer Paternal Uncle    Kidney cancer Neg Hx    Prostate cancer Neg Hx     Social History:  reports that he has quit smoking. He has never used smokeless tobacco. He reports current alcohol use of about 10.0 standard drinks of alcohol per week. He reports current drug use. Drug: Marijuana.  ROS: Pertinent ROS in HPI  Physical Exam: BP (!) 147/86   Pulse 97   Ht  5' 11 (1.803 m)   Wt 240 lb (108.9 kg)   SpO2 97%   BMI 33.47 kg/m   Constitutional:  Well nourished. Alert and oriented, No acute distress. HEENT: [place] AT, moist mucus membranes.  Trachea midline, no masses. Cardiovascular: No clubbing, cyanosis, or edema. Respiratory: Normal respiratory effort, no increased work of breathing. GI: Abdomen is soft, non tender, non distended, no abdominal masses. Liver and spleen not palpable.  No hernias appreciated.  Stool sample for occult testing is not indicated.   GU: No CVA tenderness.  No bladder fullness or masses.  Patient with circumcised phallus. Urethral meatus is patent.  No penile discharge. No penile lesions or rashes. Scrotum without lesions, cysts, rashes and/or edema.  Testicles are located scrotally bilaterally. No masses are appreciated in the testicles. Left and right  epididymis are normal. Rectal: Patient with  normal sphincter tone. Anus and perineum without scarring or rashes. No rectal masses are appreciated. Prostate is approximately 45 grams, flat, no nodules are appreciated. Seminal vesicles could not be palpated  Skin: No rashes, bruises or suspicious lesions. Lymph: No cervical or inguinal adenopathy. Neurologic: Grossly intact, no focal deficits, moving all 4 extremities. Psychiatric: Normal mood and affect.  Laboratory Data: See Epic and HPI   I have reviewed the labs.   Pertinent Imaging: N/A  Assessment & Plan:    1. Erectile dysfunction  I explained that conditions like diabetes, hypertension, coronary artery disease, peripheral vascular disease, smoking, alcohol consumption, age, sleep apnea and BPH can diminish the ability to have an erection   We will obtain a serum testosterone level at this time; if it is abnormal we will need to repeat the study for confirmation   We discussed trying a PDE5 inhibitor like tadalafil 5 mg daily to help preserve function  2. Ejaculatory disorder -This may be a result of aging, weakened pelvic floor muscles or low testosterone -Testosterone level is pending, if in the future he meets criteria for hypogonadism and pursues TRT, we will reassess -If his testosterone level is normal, we can place a referral to pelvic floor physical therapy -In the meantime, he can start Kegel exercises  Return for Follow up pending labs.  These notes generated with voice recognition software. I apologize for typographical errors.  CLOTILDA HELON RIGGERS  [facility] [address]  [address] [place], [place] [address] [phone]

## 2024-05-16 ENCOUNTER — Ambulatory Visit: Payer: Self-pay | Admitting: Urology

## 2024-05-16 LAB — PSA: Prostate Specific Ag, Serum: 0.8 ng/mL (ref 0.0–4.0)

## 2024-05-16 LAB — TESTOSTERONE: Testosterone: 357 ng/dL (ref 264–916)

## 2024-05-19 ENCOUNTER — Other Ambulatory Visit: Payer: Self-pay | Admitting: Urology

## 2024-05-19 DIAGNOSIS — R3912 Poor urinary stream: Secondary | ICD-10-CM

## 2024-05-21 ENCOUNTER — Telehealth: Payer: Self-pay

## 2024-05-21 NOTE — Telephone Encounter (Signed)
 Started PA on Cover my meds

## 2024-05-29 ENCOUNTER — Telehealth: Payer: Self-pay | Admitting: Family Medicine

## 2024-05-29 NOTE — Telephone Encounter (Signed)
 Documenting-   Ralph Oliver, Ralph LABOR, MD Thank you for the referral patient has been called three times and letter sent no response from the patient. Is the referral still needed?  Sounds like calls were made and letter sent  Nothing more to do   Referral will be closed

## 2024-06-19 ENCOUNTER — Telehealth: Payer: Self-pay

## 2024-06-19 NOTE — Telephone Encounter (Signed)
 Stated a Prior Authorization on [employer]
